# Patient Record
Sex: Female | Born: 1965 | Hispanic: No | Marital: Single | Smoking: Never smoker
Health system: Southern US, Community
[De-identification: ages and names within clinical notes are randomized; demographics above are authoritative.]

## PROBLEM LIST (undated history)

## (undated) DIAGNOSIS — E119 Type 2 diabetes mellitus without complications: Secondary | ICD-10-CM

## (undated) DIAGNOSIS — K469 Unspecified abdominal hernia without obstruction or gangrene: Secondary | ICD-10-CM

## (undated) HISTORY — PX: CATARACT EXTRACTION W/ INTRAOCULAR LENS  IMPLANT, BILATERAL: SHX1307

---

## 2007-02-20 ENCOUNTER — Ambulatory Visit: Payer: Self-pay | Admitting: Internal Medicine

## 2008-10-07 ENCOUNTER — Emergency Department (HOSPITAL_COMMUNITY): Admission: EM | Admit: 2008-10-07 | Discharge: 2008-10-07 | Payer: Self-pay | Admitting: Emergency Medicine

## 2013-07-09 DIAGNOSIS — H2510 Age-related nuclear cataract, unspecified eye: Secondary | ICD-10-CM | POA: Insufficient documentation

## 2013-07-09 DIAGNOSIS — H401190 Primary open-angle glaucoma, unspecified eye, stage unspecified: Secondary | ICD-10-CM | POA: Insufficient documentation

## 2014-01-14 ENCOUNTER — Encounter (HOSPITAL_COMMUNITY): Payer: Self-pay | Admitting: Emergency Medicine

## 2014-01-14 ENCOUNTER — Observation Stay (HOSPITAL_COMMUNITY)
Admission: EM | Admit: 2014-01-14 | Discharge: 2014-01-15 | Disposition: A | Discharge status: Home / Self Care | Payer: Medicaid Other | Attending: Internal Medicine | Admitting: Internal Medicine

## 2014-01-14 DIAGNOSIS — R5383 Other fatigue: Secondary | ICD-10-CM

## 2014-01-14 DIAGNOSIS — E119 Type 2 diabetes mellitus without complications: Secondary | ICD-10-CM | POA: Diagnosis not present

## 2014-01-14 DIAGNOSIS — R739 Hyperglycemia, unspecified: Secondary | ICD-10-CM | POA: Diagnosis present

## 2014-01-14 DIAGNOSIS — B3731 Acute candidiasis of vulva and vagina: Secondary | ICD-10-CM | POA: Diagnosis not present

## 2014-01-14 DIAGNOSIS — B373 Candidiasis of vulva and vagina: Secondary | ICD-10-CM | POA: Insufficient documentation

## 2014-01-14 DIAGNOSIS — R5381 Other malaise: Secondary | ICD-10-CM | POA: Diagnosis present

## 2014-01-14 HISTORY — DX: Type 2 diabetes mellitus without complications: E11.9

## 2014-01-14 HISTORY — DX: Unspecified abdominal hernia without obstruction or gangrene: K46.9

## 2014-01-14 LAB — CBC WITH DIFFERENTIAL/PLATELET
BASOS ABS: 0 10*3/uL (ref 0.0–0.1)
Basophils Relative: 0 % (ref 0–1)
EOS PCT: 1 % (ref 0–5)
Eosinophils Absolute: 0.1 10*3/uL (ref 0.0–0.7)
HEMATOCRIT: 42.9 % (ref 36.0–46.0)
Hemoglobin: 14.7 g/dL (ref 12.0–15.0)
LYMPHS ABS: 2.9 10*3/uL (ref 0.7–4.0)
LYMPHS PCT: 37 % (ref 12–46)
MCH: 31.1 pg (ref 26.0–34.0)
MCHC: 34.3 g/dL (ref 30.0–36.0)
MCV: 90.9 fL (ref 78.0–100.0)
MONO ABS: 0.4 10*3/uL (ref 0.1–1.0)
Monocytes Relative: 5 % (ref 3–12)
Neutro Abs: 4.5 10*3/uL (ref 1.7–7.7)
Neutrophils Relative %: 57 % (ref 43–77)
Platelets: 204 10*3/uL (ref 150–400)
RBC: 4.72 MIL/uL (ref 3.87–5.11)
RDW: 13.5 % (ref 11.5–15.5)
WBC: 7.8 10*3/uL (ref 4.0–10.5)

## 2014-01-14 LAB — URINALYSIS W MICROSCOPIC (NOT AT ARMC)
BILIRUBIN URINE: NEGATIVE
Hgb urine dipstick: NEGATIVE
KETONES UR: 15 mg/dL — AB
Leukocytes, UA: NEGATIVE
Nitrite: NEGATIVE
PH: 5 (ref 5.0–8.0)
Protein, ur: NEGATIVE mg/dL
Specific Gravity, Urine: 1.01 (ref 1.005–1.030)
Urobilinogen, UA: 0.2 mg/dL (ref 0.0–1.0)

## 2014-01-14 LAB — COMPREHENSIVE METABOLIC PANEL
ALBUMIN: 3.9 g/dL (ref 3.5–5.2)
ALK PHOS: 121 U/L — AB (ref 39–117)
ALT: 33 U/L (ref 0–35)
AST: 23 U/L (ref 0–37)
BUN: 13 mg/dL (ref 6–23)
CO2: 22 mEq/L (ref 19–32)
Calcium: 9.8 mg/dL (ref 8.4–10.5)
Chloride: 93 mEq/L — ABNORMAL LOW (ref 96–112)
Creatinine, Ser: 0.6 mg/dL (ref 0.50–1.10)
GFR calc Af Amer: >90 mL/min (ref >90)
GFR calc non Af Amer: >90 mL/min (ref >90)
Glucose, Bld: 691 mg/dL (ref 70–99)
POTASSIUM: 4.2 meq/L (ref 3.7–5.3)
SODIUM: 131 meq/L — AB (ref 137–147)
TOTAL PROTEIN: 7.3 g/dL (ref 6.0–8.3)
Total Bilirubin: 0.6 mg/dL (ref 0.3–1.2)

## 2014-01-14 LAB — I-STAT VENOUS BLOOD GAS, ED
ACID-BASE DEFICIT: 1 mmol/L (ref 0.0–2.0)
Bicarbonate: 25.8 mEq/L — ABNORMAL HIGH (ref 20.0–24.0)
O2 SAT: 40 %
TCO2: 27 mmol/L (ref 0–100)
pCO2, Ven: 48.5 mmHg (ref 45.0–50.0)
pH, Ven: 7.334 — ABNORMAL HIGH (ref 7.250–7.300)
pO2, Ven: 25 mmHg — CL (ref 30.0–45.0)

## 2014-01-14 LAB — CBG MONITORING, ED
GLUCOSE-CAPILLARY: 281 mg/dL — AB (ref 70–99)
Glucose-Capillary: 359 mg/dL — ABNORMAL HIGH (ref 70–99)

## 2014-01-14 LAB — GLUCOSE, CAPILLARY: Glucose-Capillary: 355 mg/dL — ABNORMAL HIGH (ref 70–99)

## 2014-01-14 MED ORDER — SODIUM CHLORIDE 0.9 % IV SOLN
INTRAVENOUS | Status: DC
  Administered 2014-01-14: 15:00:00 via INTRAVENOUS

## 2014-01-14 MED ORDER — INSULIN ASPART 100 UNIT/ML ~~LOC~~ SOLN: 0.0000 [IU] | Freq: Three times a day (TID) | SUBCUTANEOUS | Status: DC

## 2014-01-14 MED ORDER — DEXTROSE-NACL 5-0.45 % IV SOLN: INTRAVENOUS | Status: DC

## 2014-01-14 MED ORDER — SODIUM CHLORIDE 0.9 % IV BOLUS (SEPSIS)
1000.0000 mL | Freq: Once | INTRAVENOUS | Status: AC
  Administered 2014-01-14: 1000 mL via INTRAVENOUS

## 2014-01-14 MED ORDER — ENOXAPARIN SODIUM 40 MG/0.4ML ~~LOC~~ SOLN
40.0000 mg | SUBCUTANEOUS | Status: DC
  Administered 2014-01-14: 40 mg via SUBCUTANEOUS
  Filled 2014-01-14 (×2): qty 0.4

## 2014-01-14 MED ORDER — SODIUM CHLORIDE 0.9 % IV SOLN
INTRAVENOUS | Status: DC
  Filled 2014-01-14: qty 1

## 2014-01-14 MED ORDER — SODIUM CHLORIDE 0.9 % IV SOLN
INTRAVENOUS | Status: DC
  Administered 2014-01-14 – 2014-01-15 (×2): via INTRAVENOUS

## 2014-01-14 MED ORDER — DEXTROSE 50 % IV SOLN: 25.0000 mL | INTRAVENOUS | Status: DC | PRN

## 2014-01-14 MED ORDER — INSULIN REGULAR BOLUS VIA INFUSION
0.0000 [IU] | Freq: Three times a day (TID) | INTRAVENOUS | Status: DC
  Filled 2014-01-14: qty 10

## 2014-01-14 NOTE — Progress Notes (Signed)
Internal Medicine Attending Admission Note Date: 01/14/2014  Patient name: Keymora Aubut Medical record number: [id number] Date of birth: 06/22/1966 Age: 48 y.o. Gender: female  I saw and evaluated the patient. I reviewed the resident's note and I agree with the resident's findings and plan as documented in the resident's note.  Chief Complaint(s): worsening fatigue  History - key components related to admission: 48 y/o female with no known PMH p/w worsening fatigue over the past 2 weeks. She also complains of polyuria, polydipsia and dry mouth. In ED found to have CBG of 691. No n/v, no abd pain, no diarrhea, no fevers, no lightheadness, no syncope. Remaining ROS negative   Physical Exam - key components related to admission:  Filed Vitals:   01/14/14 1730 01/14/14 1800 01/14/14 1849 01/14/14 2105  BP: 130/74 118/71 137/83 100/62  Pulse: 71 63 62 75  Temp:   98.1 F (36.7 C) 98.1 F (36.7 C)  TempSrc:   Oral Oral  Resp: 17 18 16 16   Height:   5' (1.524 m)   Weight:   154 lb 1.6 oz (69.9 kg)   SpO2: 100% 99% 98% 96%  Cardio- RRR, normal heart sounds Lungs- CTA b/l Abd- soft, non tender, non distended, BS+ Ext- no pedal edema Gen - AAO*3, NAD  Lab results:   Basic Metabolic Panel:  Recent Labs  41/66/06 1200  NA 131*  K 4.2  CL 93*  CO2 22  GLUCOSE 691*  BUN 13  CREATININE 0.60  CALCIUM 9.8   Liver Function Tests:  Recent Labs  01/14/14 1200  AST 23  ALT 33  ALKPHOS 121*  BILITOT 0.6  PROT 7.3  ALBUMIN 3.9   No results found for this basename: LIPASE, AMYLASE,  in the last 72 hours No results found for this basename: AMMONIA,  in the last 72 hours CBC:  Recent Labs  01/14/14 1200  WBC 7.8  NEUTROABS 4.5  HGB 14.7  HCT 42.9  MCV 90.9  PLT 204   Cardiac Enzymes: No results found for this basename: CKTOTAL, CKMB, CKMBINDEX, TROPONINI,  in the last 72 hours BNP: No components found with this basename: POCBNP,  D-Dimer: No results found for this  basename: DDIMER,  in the last 72 hours CBG:  Recent Labs  01/14/14 1453 01/14/14 1627 01/14/14 2108  GLUCAP 359* 281* 355*   Hemoglobin A1C: No results found for this basename: HGBA1C,  in the last 72 hours Fasting Lipid Panel: No results found for this basename: CHOL, HDL, LDLCALC, TRIG, CHOLHDL, LDLDIRECT,  in the last 72 hours Thyroid Function Tests: No results found for this basename: TSH, T4TOTAL, FREET4, T3FREE, THYROIDAB,  in the last 72 hours Anemia Panel: No results found for this basename: VITAMINB12, FOLATE, FERRITIN, TIBC, IRON, RETICCTPCT,  in the last 72 hours Coagulation: No results found for this basename: INR,  in the last 72 hours Alcohol Level: No results found for this basename: ETH,  in the last 72 hours   Imaging results:  No results found.   Assessment & Plan by Problem:  Active Problems:   Diabetes   Hyperglycemia  New onset DM likely type 2 - c/w IVF - NS @125cc /hr - c/w SSI, sensitive. - Monitor CBG - if elevated will start lantus - f/u A1C - consult diabetes coordinator - Will likely need oral hypoglycemics on discharge

## 2014-01-14 NOTE — ED Notes (Signed)
Dr Rhunette Croft aware of critical Glucose.

## 2014-01-14 NOTE — ED Notes (Signed)
Pt reports that for 2 weeks she is feeling tired; dry mouth. Drinks a lot water and urinating frequently.

## 2014-01-14 NOTE — H&P (Signed)
Date: 01/14/2014               Patient Name:  Maria Short MRN: [id number]  DOB: 06-Dec-1965 Age / Sex: 48 y.o., female   PCP: No primary provider on file.         Medical Service: Internal Medicine Teaching Service         Attending Physician: Dr. Earl Lagos, MD    First Contact: Dr. Darci Needle Pager: [phone]  Second Contact: Dr. Zada Girt Pager: [phone]       After Hours (After 5p/  First Contact Pager: [phone]  weekends / holidays): Second Contact Pager: [phone]   Chief Complaint: fatigue, polyuria   History of Present Illness:  This is a 48yo Somali speaking woman with no known PMH who presents with c/o generalized fatigue, dry mouth, polyuria and polydipsia x 1-2 weeks. Patient with no known prior hx of diabetes, found to have CBG 691 in the ED. Patient feeling better s/p 3L NS at the time of my interview. Also complaining of some vaginal itching, though no dysuria, vaginal odor or discharge. Denies abd pain, N/V/D, CP, SOB.  Patient does not follow with a PCP.  She lives at home with her husband and 7 children. Had cataract surgery recently.  Meds: Current Facility-Administered Medications  Medication Dose Route Frequency Provider Last Rate Last Dose  . 0.9 %  sodium chloride infusion   Intravenous Continuous Tatyana A Kirichenko, PA-C 125 mL/hr at 01/14/14 1452    . 0.9 %  sodium chloride infusion   Intravenous Continuous Dow Adolph, MD      . dextrose 5 %-0.45 % sodium chloride infusion   Intravenous Continuous Tatyana A Kirichenko, PA-C      . dextrose 50 % solution 25 mL  25 mL Intravenous PRN Tatyana A Kirichenko, PA-C      . insulin regular (NOVOLIN R,HUMULIN R) 1 Units/mL in sodium chloride 0.9 % 100 mL infusion   Intravenous Continuous Tatyana A Kirichenko, PA-C      . insulin regular bolus via infusion 0-10 Units  0-10 Units Intravenous TID WC Tatyana A Kirichenko, PA-C       No current outpatient prescriptions on file.    Allergies: Allergies as of  01/14/2014  . (No Known Allergies)   Past Medical History  Diagnosis Date  . Cataract     sugery performed for them   History reviewed. No pertinent past surgical history. History reviewed. No pertinent family history. Niece has DMT2.  History   Social History  . Marital Status: Single    Spouse Name: N/A    Number of Children: N/A  . Years of Education: N/A   Occupational History  . Not on file.   Social History Main Topics  . Smoking status: Never Smoker   . Smokeless tobacco: No  . Alcohol Use: No  . Drug Use: No  . Sexual Activity: Not on file   Other Topics Concern  . Not on file   Social History Narrative  . No narrative on file   Review of Systems: A comprehensive 10 point ROS was performed and was negative other that what is noted in the HPI.  Physical Exam: Blood pressure 105/61, pulse 63, temperature 97.7 F (36.5 C), temperature source Oral, resp. rate 14, height 5' (1.524 m), weight 152 lb (68.947 kg), SpO2 98.00%. General: alert, cooperative, and in no apparent distress HEENT: pupils equal round and reactive to light, vision grossly intact, oropharynx clear and non-erythematous  Neck: supple  Lungs: CTAB Heart: RRR Abdomen: soft, non-tender, non-distended, normal bowel sounds Extremities: warm, no pedal edema Neurologic: alert & oriented X3, cranial nerves II-XII grossly intact, strength grossly intact, sensation intact to light touch  Lab results: Basic Metabolic Panel:  Recent Labs  16/05/9605/08/15 1200  NA 131*  K 4.2  CL 93*  CO2 22  GLUCOSE 691*  BUN 13  CREATININE 0.60  CALCIUM 9.8   Liver Function Tests:  Recent Labs  01/14/14 1200  AST 23  ALT 33  ALKPHOS 121*  BILITOT 0.6  PROT 7.3  ALBUMIN 3.9   CBC:  Recent Labs  01/14/14 1200  WBC 7.8  NEUTROABS 4.5  HGB 14.7  HCT 42.9  MCV 90.9  PLT 204   CBG:  Recent Labs  01/14/14 1453  GLUCAP 359*   Urinalysis:  Recent Labs  01/14/14 1330  COLORURINE STRAW*    LABSPEC 1.010  PHURINE 5.0  GLUCOSEU >1000*  HGBUR NEGATIVE  BILIRUBINUR NEGATIVE  KETONESUR 15*  PROTEINUR NEGATIVE  UROBILINOGEN 0.2  NITRITE NEGATIVE  LEUKOCYTESUR NEGATIVE   Other results: EKG: NSR, no ischemic changes noted   Assessment & Plan by Problem:  # New onset type 2 DM: Patient with polyuria, polydipsia, dry mouth, generalized fatigue found to have a CBG in the ED of 691. Patient feeling much improved after receiving approx 3L NS in the ED. VBG shows pt with normal pH. No concern for DKA at this time, no AG. CMP, CBC unremarkable. UA not significant other than >1000 glucose. Pt with possible yeast infection given itching, which will likely resolve once blood glucose under better control.  -admit to med surg for obs -NS at 125cc/hr -SSI, sensitive -plan to transition to oral hypoglycemic at time of discharge, pt will need to establish with a PCP -A1c -BMp in AM -carb mod diet -consult to diabetes coordinator   # VTE: lovenox  # Diet: carb mod  Code status: full   Dispo: Disposition is deferred at this time, awaiting improvement of current medical problems. Anticipated discharge in approximately 1-2 day(s).   The patient does not have a current PCP (No primary provider on file.) and [facility] after discharge.  The patient does not have transportation limitations that hinder transportation to clinic appointments.  Signed: Windell Hummingbirdachel Chikowski, MD 01/14/2014, 5:43 PM   I have reviewed the residents note and agree with plan and findings as outlined.Please refer to my note for further details

## 2014-01-14 NOTE — ED Notes (Signed)
Pt undressed, In gown, on monitor, pulse oximetry and bp cuff.

## 2014-01-14 NOTE — ED Provider Notes (Signed)
CSN: [id number]     Arrival date & time 01/14/14  1121 History   First MD Initiated Contact with Patient 01/14/14 1309     Chief Complaint  Patient presents with  . Fatigue     (Consider location/radiation/quality/duration/timing/severity/associated sxs/prior Treatment) HPI Maria Short is a 47 y.o. female who presents to emergency department complaining of generalized malaise, fatigue, dry mouth, urinary frequency and polydipsia. Patient states her symptoms started approximately 2 weeks ago. Patient denies any pain. She denies any headache. No chest pain, no abdominal pain. Denies any medical problems or taking any medications daily. She does not have a primary care doctor. Denies any recent ill contacts. Denies any URI symptoms. No nausea, vomiting, diarrhea.  Past Medical History  Diagnosis Date  . Cataract     sugery performed for them   No past surgical history on file. No family history on file. History  Substance Use Topics  . Smoking status: Not on file  . Smokeless tobacco: Not on file  . Alcohol Use: Not on file   OB History   Grav Para Term Preterm Abortions TAB SAB Ect Mult Living                 Review of Systems  Constitutional: Negative for fever and chills.  Respiratory: Negative for cough, chest tightness and shortness of breath.   Cardiovascular: Negative for chest pain, palpitations and leg swelling.  Gastrointestinal: Negative for nausea, vomiting, abdominal pain and diarrhea.  Genitourinary: Negative for dysuria, flank pain, vaginal bleeding, vaginal discharge, vaginal pain and pelvic pain.  Musculoskeletal: Negative for arthralgias, myalgias, neck pain and neck stiffness.  Skin: Negative for rash.  Neurological: Negative for dizziness, weakness and headaches.  All other systems reviewed and are negative.     Allergies  Review of patient's allergies indicates no known allergies.  Home Medications   Prior to Admission medications   Not on File    BP 113/96  Pulse 63  Temp(Src) 97.7 F (36.5 C) (Oral)  Resp 17  Ht 5' (1.524 m)  Wt 152 lb (68.947 kg)  BMI 29.69 kg/m2  SpO2 99% Physical Exam  Nursing note and vitals reviewed. Constitutional: She is oriented to person, place, and time. She appears well-developed and well-nourished. No distress.  HENT:  Head: Normocephalic.  Eyes: Conjunctivae are normal.  Neck: Neck supple.  Cardiovascular: Normal rate, regular rhythm and normal heart sounds.   Pulmonary/Chest: Effort normal and breath sounds normal. No respiratory distress. She has no wheezes. She has no rales.  Abdominal: Soft. Bowel sounds are normal. She exhibits no distension. There is no tenderness. There is no rebound.  Musculoskeletal: She exhibits no edema.  Neurological: She is alert and oriented to person, place, and time.  Skin: Skin is warm and dry.  Psychiatric: She has a normal mood and affect. Her behavior is normal.    ED Course  Procedures (including critical care time) Labs Review Labs Reviewed  COMPREHENSIVE METABOLIC PANEL - Abnormal; Notable for the following:    Sodium 131 (*)    Chloride 93 (*)    Glucose, Bld 691 (*)    Alkaline Phosphatase 121 (*)    All other components within normal limits  URINALYSIS W MICROSCOPIC - Abnormal; Notable for the following:    Color, Urine STRAW (*)    Glucose, UA >1000 (*)    Ketones, ur 15 (*)    All other components within normal limits  I-STAT VENOUS BLOOD GAS, ED - Abnormal; Notable for  the following:    pH, Ven 7.334 (*)    pO2, Ven 25.0 (*)    Bicarbonate 25.8 (*)    All other components within normal limits  CBG MONITORING, ED - Abnormal; Notable for the following:    Glucose-Capillary 359 (*)    All other components within normal limits  URINE CULTURE  CBC WITH DIFFERENTIAL    Imaging Review No results found.   EKG Interpretation None      MDM   Final diagnoses:  Hyperglycemia  Fatigue    Show generalized fatigue, polyuria,  polydipsia. Blood sugar is 691. No history of diabetes. IV fluids started, and glucose stabilizes started. Will admit given no primary care Dr any onset of diabetes. Discussed results with patient she agrees to the plan.    Spoke with teaching service will admit patient  Filed Vitals:   01/14/14 1415 01/14/14 1500 01/14/14 1521 01/14/14 1522  BP: 113/96 138/73    Pulse: 63 63 60 61  Temp:      TempSrc:      Resp: 17 15 18 16   Height:      Weight:      SpO2: 99% 98% 100% 100%      Lottie Musselatyana A Adelayde Minney, PA-C 01/14/14 1608  Merina Behrendt A Daniyah Fohl, PA-C 01/15/14 1126

## 2014-01-15 DIAGNOSIS — B3731 Acute candidiasis of vulva and vagina: Secondary | ICD-10-CM | POA: Diagnosis present

## 2014-01-15 DIAGNOSIS — B373 Candidiasis of vulva and vagina: Secondary | ICD-10-CM

## 2014-01-15 LAB — GLUCOSE, CAPILLARY
GLUCOSE-CAPILLARY: 184 mg/dL — AB (ref 70–99)
Glucose-Capillary: 173 mg/dL — ABNORMAL HIGH (ref 70–99)
Glucose-Capillary: 370 mg/dL — ABNORMAL HIGH (ref 70–99)
Glucose-Capillary: 399 mg/dL — ABNORMAL HIGH (ref 70–99)

## 2014-01-15 LAB — BASIC METABOLIC PANEL
BUN: 8 mg/dL (ref 6–23)
CHLORIDE: 106 meq/L (ref 96–112)
CO2: 20 mEq/L (ref 19–32)
Calcium: 8.1 mg/dL — ABNORMAL LOW (ref 8.4–10.5)
Creatinine, Ser: 0.47 mg/dL — ABNORMAL LOW (ref 0.50–1.10)
Glucose, Bld: 195 mg/dL — ABNORMAL HIGH (ref 70–99)
POTASSIUM: 3.5 meq/L — AB (ref 3.7–5.3)
Sodium: 138 mEq/L (ref 137–147)

## 2014-01-15 LAB — HEMOGLOBIN A1C
HEMOGLOBIN A1C: 15.3 % — AB (ref <5.7)
Mean Plasma Glucose: 392 mg/dL — ABNORMAL HIGH (ref <117)

## 2014-01-15 MED ORDER — FLUCONAZOLE 150 MG PO TABS
150.0000 mg | ORAL_TABLET | Freq: Once | ORAL | Status: AC
  Administered 2014-01-15: 150 mg via ORAL
  Filled 2014-01-15: qty 1

## 2014-01-15 MED ORDER — GLIPIZIDE 5 MG PO TABS: 2.5000 mg | ORAL_TABLET | Freq: Every day | ORAL | Status: DC

## 2014-01-15 MED ORDER — LIVING WELL WITH DIABETES BOOK
Freq: Once | Status: AC
  Administered 2014-01-15: 09:00:00
  Filled 2014-01-15: qty 1

## 2014-01-15 MED ORDER — GLIPIZIDE 5 MG PO TABS: 2.5000 mg | ORAL_TABLET | Freq: Two times a day (BID) | ORAL | Status: DC

## 2014-01-15 MED ORDER — INSULIN ASPART 100 UNIT/ML ~~LOC~~ SOLN
0.0000 [IU] | SUBCUTANEOUS | Status: DC
  Administered 2014-01-15: 9 [IU] via SUBCUTANEOUS
  Administered 2014-01-15: 2 [IU] via SUBCUTANEOUS
  Administered 2014-01-15: 9 [IU] via SUBCUTANEOUS
  Administered 2014-01-15: 2 [IU] via SUBCUTANEOUS

## 2014-01-15 MED ORDER — METFORMIN HCL 500 MG PO TABS: 500.0000 mg | ORAL_TABLET | Freq: Two times a day (BID) | ORAL | Status: DC

## 2014-01-15 NOTE — Discharge Summary (Signed)
Name: Maria Short MRN: [id number] DOB: 01/24/1966 48 y.o. PCP: No primary provider on file.  Date of Admission: 01/14/2014  1:09 PM Date of Discharge: 01/15/2014 Attending Physician: Earl Lagos, MD  Discharge Diagnosis: Active Problems:   Diabetes- newly diagnosed 01/14/14   Hyperglycemia   Yeast infection   Discharge Medications:   Medication List         glipiZIDE 5 MG tablet  Commonly known as:  GLUCOTROL  Take 0.5 tablets (2.5 mg total) by mouth daily before breakfast.     metFORMIN 500 MG tablet  Commonly known as:  GLUCOPHAGE  Take 1 tablet (500 mg total) by mouth 2 (two) times daily with a meal.       Disposition and follow-up:   [name] was discharged from [facility] in Stable condition.  At the hospital follow up visit please address:  1.  Diabetes, type 2- please assess CBG control and if pt has gotten a glucometer and filled her rx for glipizide and metformin; how is she tolerating these medications? Episodes of hypoglycemia? Please f/u A1c which was pending at time of discharge; please schedule pt to see Lupita Leash Plyler 2. Vaginal candidiasis- pt with vaginal itching on admission, gave fluconazole 150mg  once prior to discharge; please assess for resolution of symptoms  2.  Labs / imaging needed at time of follow-up: CBG 3.  Pending labs/ test needing follow-up: A1c still pending  Follow-up Appointments: Follow-up Information   Follow up with Christen Bame, MD On 01/29/2014. (8:45am)    Specialty:  Internal Medicine   Contact information:   [address] [place] [address] [phone]       Discharge Instructions: Discharge Instructions   Call MD for:  extreme fatigue    Complete by:  As directed      Call MD for:  persistant dizziness or light-headedness    Complete by:  As directed      Call MD for:  persistant nausea and vomiting    Complete by:  As directed      Call MD for:  temperature >100.4    Complete by:  As  directed      Diet - low sodium heart healthy    Complete by:  As directed      Increase activity slowly    Complete by:  As directed            Consultations:  none  Procedures Performed:  No results found.   Admission HPI:  This is a 48yo Somali speaking woman with no known PMH who presents with c/o generalized fatigue, dry mouth, polyuria and polydipsia x 1-2 weeks. Patient with no known prior hx of diabetes, found to have CBG 691 in the ED. Patient feeling better s/p 3L NS at the time of my interview. Also complaining of some vaginal itching, though no dysuria, vaginal odor or discharge. Denies abd pain, N/V/D, CP, SOB. Patient does not follow with a PCP. She lives at home with her husband and 7 children. Had cataract surgery recently.  Hospital Course by problem list:  # New onset type 2 DM: Patient with polyuria, polydipsia, dry mouth, generalized fatigue found to have a CBG in the ED of 691. VBG showed pt with normal pH. No concern for DKA, no AG. CMP, CBC unremarkable. UA not significant other than >1000 glucose. Patient's symptoms essentially completely resolved after approx 4.5L NS during her admission. Her VS remained stable overnight. Pt managed on SSI and  CBGs ranged 173-399 overnight, received a total of 11U SSI. Diabetes educator and dietician saw patient prior to her discharge. Patient was educated using the translator phone on her new dx, how to obtain a glucometer and check her CBG at home. She was told about symptoms of hyper and hypoglycemia to be aware of, and taught how to treat hypoglycemia. Pt provided w/ education booklet and given education about carbohydrates/dietary changes. Patient was discharged home on glipizide 2.5mg  daily and metformin 500mg  BID, which may need to be uptitrated as outpatient depending on how her CBGs run at home. She was asked to bring her meter with her to her HFU visit. A1c still pending at time of discharge, please follow this up at her f/u  visit.   #Vaginal candidiasis: Vaginal itching with erythema on exam, no discharge or odor. In the setting of undiagnosed DM, I suspect yeast infection. Pt requested something for treatment, so a one time dose of fluconazole 150mg  was given prior to discharge. Please assess for symptom resolution at f/u appt.  Discharge Vitals:   BP 114/73  Pulse 62  Temp(Src) 97.9 F (36.6 C) (Oral)  Resp 18  Ht 5' (1.524 m)  Wt 154 lb 1.6 oz (69.9 kg)  BMI 30.10 kg/m2  SpO2 96%  Discharge Labs:  Results for orders placed during the hospital encounter of 01/14/14 (from the past 24 hour(s))  URINALYSIS W MICROSCOPIC     Status: Abnormal   Collection Time    01/14/14  1:30 PM      Result Value Ref Range   Color, Urine STRAW (*) YELLOW   APPearance CLEAR  CLEAR   Specific Gravity, Urine 1.010  1.005 - 1.030   pH 5.0  5.0 - 8.0   Glucose, UA >1000 (*) NEGATIVE mg/dL   Hgb urine dipstick NEGATIVE  NEGATIVE   Bilirubin Urine NEGATIVE  NEGATIVE   Ketones, ur 15 (*) NEGATIVE mg/dL   Protein, ur NEGATIVE  NEGATIVE mg/dL   Urobilinogen, UA 0.2  0.0 - 1.0 mg/dL   Nitrite NEGATIVE  NEGATIVE   Leukocytes, UA NEGATIVE  NEGATIVE   Bacteria, UA RARE  RARE   Squamous Epithelial / LPF RARE  RARE  I-STAT VENOUS BLOOD GAS, ED     Status: Abnormal   Collection Time    01/14/14  1:53 PM      Result Value Ref Range   pH, Ven 7.334 (*) 7.250 - 7.300   pCO2, Ven 48.5  45.0 - 50.0 mmHg   pO2, Ven 25.0 (*) 30.0 - 45.0 mmHg   Bicarbonate 25.8 (*) 20.0 - 24.0 mEq/L   TCO2 27  0 - 100 mmol/L   O2 Saturation 40.0     Acid-base deficit 1.0  0.0 - 2.0 mmol/L   Collection site BRACHIAL ARTERY     Sample type VENOUS     Comment NOTIFIED PHYSICIAN    CBG MONITORING, ED     Status: Abnormal   Collection Time    01/14/14  2:53 PM      Result Value Ref Range   Glucose-Capillary 359 (*) 70 - 99 mg/dL  CBG MONITORING, ED     Status: Abnormal   Collection Time    01/14/14  4:27 PM      Result Value Ref Range    Glucose-Capillary 281 (*) 70 - 99 mg/dL  GLUCOSE, CAPILLARY     Status: Abnormal   Collection Time    01/14/14  9:08 PM      Result  Value Ref Range   Glucose-Capillary 355 (*) 70 - 99 mg/dL  GLUCOSE, CAPILLARY     Status: Abnormal   Collection Time    01/15/14 12:35 AM      Result Value Ref Range   Glucose-Capillary 399 (*) 70 - 99 mg/dL  GLUCOSE, CAPILLARY     Status: Abnormal   Collection Time    01/15/14  4:33 AM      Result Value Ref Range   Glucose-Capillary 173 (*) 70 - 99 mg/dL   Comment 1 Notify RN     Comment 2 Documented in Chart    BASIC METABOLIC PANEL     Status: Abnormal   Collection Time    01/15/14  5:51 AM      Result Value Ref Range   Sodium 138  137 - 147 mEq/L   Potassium 3.5 (*) 3.7 - 5.3 mEq/L   Chloride 106  96 - 112 mEq/L   CO2 20  19 - 32 mEq/L   Glucose, Bld 195 (*) 70 - 99 mg/dL   BUN 8  6 - 23 mg/dL   Creatinine, Ser 1.610.47 (*) 0.50 - 1.10 mg/dL   Calcium 8.1 (*) 8.4 - 10.5 mg/dL   GFR calc non Af Amer >90  >90 mL/min   GFR calc Af Amer >90  >90 mL/min  GLUCOSE, CAPILLARY     Status: Abnormal   Collection Time    01/15/14  7:58 AM      Result Value Ref Range   Glucose-Capillary 184 (*) 70 - 99 mg/dL  GLUCOSE, CAPILLARY     Status: Abnormal   Collection Time    01/15/14 11:49 AM      Result Value Ref Range   Glucose-Capillary 370 (*) 70 - 99 mg/dL    Signed: Windell Hummingbirdachel Juandaniel Manfredo, MD 01/15/2014, 1:08 PM   Time Spent on Discharge: 35 minutes Services Ordered on Discharge: none Equipment Ordered on Discharge: none

## 2014-01-15 NOTE — Progress Notes (Signed)
Pt being discharged home. Diabetes educator spoke with son in the room and I gave him the living with diabetes booklet. Went thru discharge instructions and prescriptions with son. Pt's son and patient verbalized understanding. 2 IV sites d/ced - site unremarkable. Skin intact, vitals stable. Pt and patient's son verbalized understanding of follow up appointments with primary care doctor on 6/23. No complaints at this time, Will continue to monitor until patient is off unit. Jaz Laningham D Rivka Safer

## 2014-01-15 NOTE — Progress Notes (Addendum)
Inpatient Diabetes Program Recommendations  AACE/ADA: New Consensus Statement on Inpatient Glycemic Control (2013)  Target Ranges:  Prepandial:   less than 140 mg/dL      Peak postprandial:   less than 180 mg/dL (1-2 hours)      Critically ill patients:  140 - 180 mg/dL     Results for Maria Short, Maria Short (MRN [id number]) as of 01/15/2014 08:44  Ref. Range 01/14/2014 12:00  Glucose Latest Range: 70-99 mg/dL 696 (HH)     Admitted with New diagnosis of DM.  Glucose 691 mg/dl on admission.  Was given 3L NS in ED and started on Novolog Sensitive SSI at midnight.  CBGs have trended down this morning.  RD consult placed for diet education.  We will need interpreter to assist with translation today.  Per RN, patient has brother who speaks Somali.  Have called SW office to try to arrange for [place] interpreter, however, they have advised me to use the telephonic interpreter line in case they cannot find a Somali interpreter.   1253 Addendum:  Spoke with patient and her son (son used as Nurse, learning disability).  Spoke with pt about new diagnosis.  Discussed basic pathophysiology of DM Type 2, basic home care, importance of checking CBGs and maintaining good CBG control to prevent long-term and short-term complications.  Reviewed signs and symptoms of hyperglycemia and hypoglycemia.  Reviewed how to treat hypoglycemia.  RNs to provide ongoing basic DM education at bedside with this patient.  Have ordered educational booklet.  RD spoke with patient and her son today about appropriate DM diet for home.  Also reviewed new DM medications (Metformin and Glipizide).  Explained what they are, how to take, side effects, etc.  Also discussed how to check CBGs at home.  Encouraged patient to purchase CBG meter OTC at [facility] (gave patient written information on which meter to buy).  Reviewed CBG goals with patient and son and encouraged patient to call MD at the Internal Medicine clinic if her CBGs become extremely elevated or if she has  lots of episodes of hypoglycemia.   Will see patient today Ambrose Finland RN, MSN, CDE Diabetes Coordinator Inpatient Diabetes Program Team Pager: [phone] (8a-10p)

## 2014-01-15 NOTE — Plan of Care (Signed)
Problem: Food- and Nutrition-Related Knowledge Deficit (NB-1.1) Goal: Nutrition education Formal process to instruct or train a patient/client in a skill or to impart knowledge to help patients/clients voluntarily manage or modify food choices and eating behavior to maintain or improve health. Outcome: Adequate for Discharge  RD consulted for nutrition education regarding diabetes.     No results found for this basename: HGBA1C   Pt does not speak [place]. Collaborated with pt's brother at bedside, who speaks [place] well. Homw diet is high in refined carbohydrates and sugar. Pt reveals that she adds sugar to milk and drink 1-2 bottles of soda daily.   RD provided "Carbohydrate Counting for People with Diabetes" handout from the Academy of Nutrition and Dietetics. Discussed different food groups and their effects on blood sugar, emphasizing carbohydrate-containing foods. Provided list of carbohydrates and recommended serving sizes of common foods.  Discussed importance of controlled and consistent carbohydrate intake throughout the day. Provided examples of ways to balance meals/snacks and encouraged intake of high-fiber, whole grain complex carbohydrates. Teach back method used.  Expect fair to good compliance.  Body mass index is 30.1 kg/(m^2). Pt meets criteria for obesity, class Ibased on current BMI.  Current diet order is carb modified, patient is consuming approximately 100% of meals at this time. Labs and medications reviewed. No further nutrition interventions warranted at this time. RD contact information provided. If additional nutrition issues arise, please re-consult RD.  Renn Stille A. Mayford Knife, RD, LDN Pager: [phone] After hours Pager: [phone]

## 2014-01-15 NOTE — Progress Notes (Addendum)
Subjective: Patient feeling much improved compared to yesterday. No more polyuria, polydipsia. Has more energy today. Still complaining of vaginal itching. Translator phone used for interview/exam.  Objective: Vital signs in last 24 hours: Filed Vitals:   01/14/14 1800 01/14/14 1849 01/14/14 2105 01/15/14 0437  BP: 118/71 137/83 100/62 114/73  Pulse: 63 62 75 62  Temp:  98.1 F (36.7 C) 98.1 F (36.7 C) 97.9 F (36.6 C)  TempSrc:  Oral Oral Oral  Resp: 18 16 16 18   Height:  5' (1.524 m)    Weight:  154 lb 1.6 oz (69.9 kg)    SpO2: 99% 98% 96% 96%   Weight change:   Intake/Output Summary (Last 24 hours) at 01/15/14 0743 Last data filed at 01/14/14 1453  Gross per 24 hour  Intake   1000 ml  Output      0 ml  Net   1000 ml   Physical Exam General: alert, cooperative, and in no apparent distress HEENT: pupils equal round and reactive to light, vision grossly intact, oropharynx clear and non-erythematous, MMM  Neck: supple Lungs: CTAB Heart: RRR Abdomen: soft, non-tender, non-distended, normal bowel sounds  GU: patient with some erythema to labia minora and majora, no vaginal discharge noted, no foul odor  Extremities: warm, no pedal edema Neurologic: alert & oriented X3, nonfocal   Lab Results: Basic Metabolic Panel:  Recent Labs Lab 01/14/14 1200 01/15/14 0551  NA 131* 138  K 4.2 3.5*  CL 93* 106  CO2 22 20  GLUCOSE 691* 195*  BUN 13 8  CREATININE 0.60 0.47*  CALCIUM 9.8 8.1*   Liver Function Tests:  Recent Labs Lab 01/14/14 1200  AST 23  ALT 33  ALKPHOS 121*  BILITOT 0.6  PROT 7.3  ALBUMIN 3.9   CBC:  Recent Labs Lab 01/14/14 1200  WBC 7.8  NEUTROABS 4.5  HGB 14.7  HCT 42.9  MCV 90.9  PLT 204   CBG:  Recent Labs Lab 01/14/14 1453 01/14/14 1627 01/14/14 2108 01/15/14 0035 01/15/14 0433  GLUCAP 359* 281* 355* 399* 173*   Hemoglobin A1C: No results found for this basename: HGBA1C,  in the last 168  hours  Urinalysis:  Recent Labs Lab 01/14/14 1330  COLORURINE STRAW*  LABSPEC 1.010  PHURINE 5.0  GLUCOSEU >1000*  HGBUR NEGATIVE  BILIRUBINUR NEGATIVE  KETONESUR 15*  PROTEINUR NEGATIVE  UROBILINOGEN 0.2  NITRITE NEGATIVE  LEUKOCYTESUR NEGATIVE   Medications: I have reviewed the patient's current medications. Scheduled Meds: . enoxaparin (LOVENOX) injection  40 mg Subcutaneous Q24H  . insulin aspart  0-9 Units Subcutaneous 6 times per day   Continuous Infusions: . sodium chloride 125 mL/hr at 01/15/14 0238   PRN Meds:. Assessment/Plan:  # New diagnosis of type 2 DM: Improvement of symptoms. VSS. CBGs ranged 173-399 overnight, received a total of 11U SSI. S/p approx 4.5L NS since her admission. BMP still shows no AG, no concern for DKA. Diabetes coordinator will come to see patient today. I will arrange PCP f/u in our clinic for patient and pt told that it is important to follow w/ a PCP regularly to monitor her blood sugar. A1c still pending.  -plan to discharge today with metformin 500mg  BID and glipizide 2.5mg  daily -[facility] w/ Norm Parcelonna Plyler  -DM coordinator to see -f/u A1c -SSI, sensitive -carb mod diet  #Vaginal candidiasis: Vaginal itching with erythema on exam, no discharge or odor. In the setting of undiagnosed DM, I suspect yeast infection. Pt requesting  something for treatment.  -one time dose of fluconazole 150mg  today prior to discharge  # VTE: lovenox  # Diet: carb mod  Code status: full   Dispo: Discharge today.  The patient does not have a current PCP (No primary provider on file.) and does need an [facility] hospital follow-up appointment after discharge.  The patient does not have transportation limitations that hinder transportation to clinic appointments.  .Services Needed at time of discharge: Y = Yes, Blank = No PT:   OT:   RN:   Equipment:   Other:     LOS: 1 day   Windell Hummingbird, MD 01/15/2014, 7:43 AM

## 2014-01-15 NOTE — Discharge Summary (Signed)
INTERNAL MEDICINE ATTENDING DISCHARGE COSIGN   I evaluated the patient on the day of discharge and discussed the discharge plan with my resident team. I agree with the discharge documentation and disposition.   Zayed Griffie 01/15/2014, 1:16 PM

## 2014-01-15 NOTE — Progress Notes (Signed)
Patient seen and examined. Feels well. No new complaints  Physical exam; Cardio- RRR, normal heart sounds  Lungs- CTA b/l  Abd- soft, non tender, non distended, BS+  Ext- no pedal edema  Gen - AAO*3, NAD   Assessment and Plan: I have reviewed Dr. Hollace Kinnier note and agree with findings and plan as documented  New diagnosis of DM likely Type 2: - f/u HbA1C - Will likely d/c home on metformin and glipizide - c/w SSI, sensitive - c/w modified carb diet - outpatient f/u at [facility] clinic and with Lupita Leash Pyler - Diabetes coordinator to follow up - Dietician follow up appreciated  Vaginal candidiasis - fluconazole * 1 dose today  Pt stable for d/c home today. Case d/w pt, residents in detail

## 2014-01-15 NOTE — Discharge Instructions (Signed)
We started you on two new medications to better control your blood sugar: metformin 500mg  twice per day and glipizide 2.5mg  (1/2 pill) daily. Please take these medications and do not miss doses.   Please also follow up in our clinic to establish with a primary care doctor.  Diabetes and Exercise Exercising regularly is important. It is not just about losing weight. It has many health benefits, such as:  Improving your overall fitness, flexibility, and endurance.  Increasing your bone density.  Helping with weight control.  Decreasing your body fat.  Increasing your muscle strength.  Reducing stress and tension.  Improving your overall health. People with diabetes who exercise gain additional benefits because exercise:  Reduces appetite.  Improves the body's use of blood sugar (glucose).  Helps lower or control blood glucose.  Decreases blood pressure.  Helps control blood lipids (such as cholesterol and triglycerides).  Improves the body's use of the hormone insulin by:  Increasing the body's insulin sensitivity.  Reducing the body's insulin needs.  Decreases the risk for heart disease because exercising:  Lowers cholesterol and triglycerides levels.  Increases the levels of good cholesterol (such as high-density lipoproteins [HDL]) in the body.  Lowers blood glucose levels. YOUR ACTIVITY PLAN  Choose an activity that you enjoy and set realistic goals. Your health care provider or diabetes educator can help you make an activity plan that works for you. You can break activities into 2 or 3 sessions throughout the day. Doing so is as good as one long session. Exercise ideas include:  Taking the dog for a walk.  Taking the stairs instead of the elevator.  Dancing to your favorite song.  Doing your favorite exercise with a friend. RECOMMENDATIONS FOR EXERCISING WITH TYPE 1 OR TYPE 2 DIABETES   Check your blood glucose before exercising. If blood glucose levels are  greater than 240 mg/dL, check for urine ketones. Do not exercise if ketones are present.  Avoid injecting insulin into areas of the body that are going to be exercised. For example, avoid injecting insulin into:  The arms when playing tennis.  The legs when jogging.  Keep a record of:  Food intake before and after you exercise.  Expected peak times of insulin action.  Blood glucose levels before and after you exercise.  The type and amount of exercise you have done.  Review your records with your health care provider. Your health care provider will help you to develop guidelines for adjusting food intake and insulin amounts before and after exercising.  If you take insulin or oral hypoglycemic agents, watch for signs and symptoms of hypoglycemia. They include:  Dizziness.  Shaking.  Sweating.  Chills.  Confusion.  Drink plenty of water while you exercise to prevent dehydration or heat stroke. Body water is lost during exercise and must be replaced.  Talk to your health care provider before starting an exercise program to make sure it is safe for you. Remember, almost any type of activity is better than none. Document Released: 10/16/2003 Document Revised: 03/28/2013 Document Reviewed: 01/02/2013 [facility] Patient Information 2014 [place], [place].

## 2014-01-16 LAB — URINE CULTURE

## 2014-01-16 NOTE — Discharge Planning (Signed)
[facility] Community Liaison was not able to see patient, [facility] orange card information and resource guide will be mailed to the address listed. °

## 2014-01-17 NOTE — ED Provider Notes (Signed)
Medical screening examination/treatment/procedure(s) were performed by non-physician practitioner and as supervising physician I was immediately available for consultation/collaboration.   EKG Interpretation   Date/Time:  Monday January 14 2014 14:09:49 EDT Ventricular Rate:  62 PR Interval:  156 QRS Duration: 76 QT Interval:  469 QTC Calculation: 476 R Axis:   21 Text Interpretation:  Sinus rhythm Consider left atrial enlargement Low  voltage, precordial leads Confirmed by Rhunette Croft, MD, Ronnetta Currington ([id number]) on  01/14/2014 4:32:07 PM       Derwood Kaplan, MD 01/17/14 0126

## 2014-01-28 IMAGING — CR DG WRIST COMPLETE 3+V*L*
2 series · 2 of 2 positions shown · non-contrast
Comparison: None.

CLINICAL DATA: Left wrist injury, pain.

LEFT WRIST - COMPLETE 3+ VIEW

[PA]
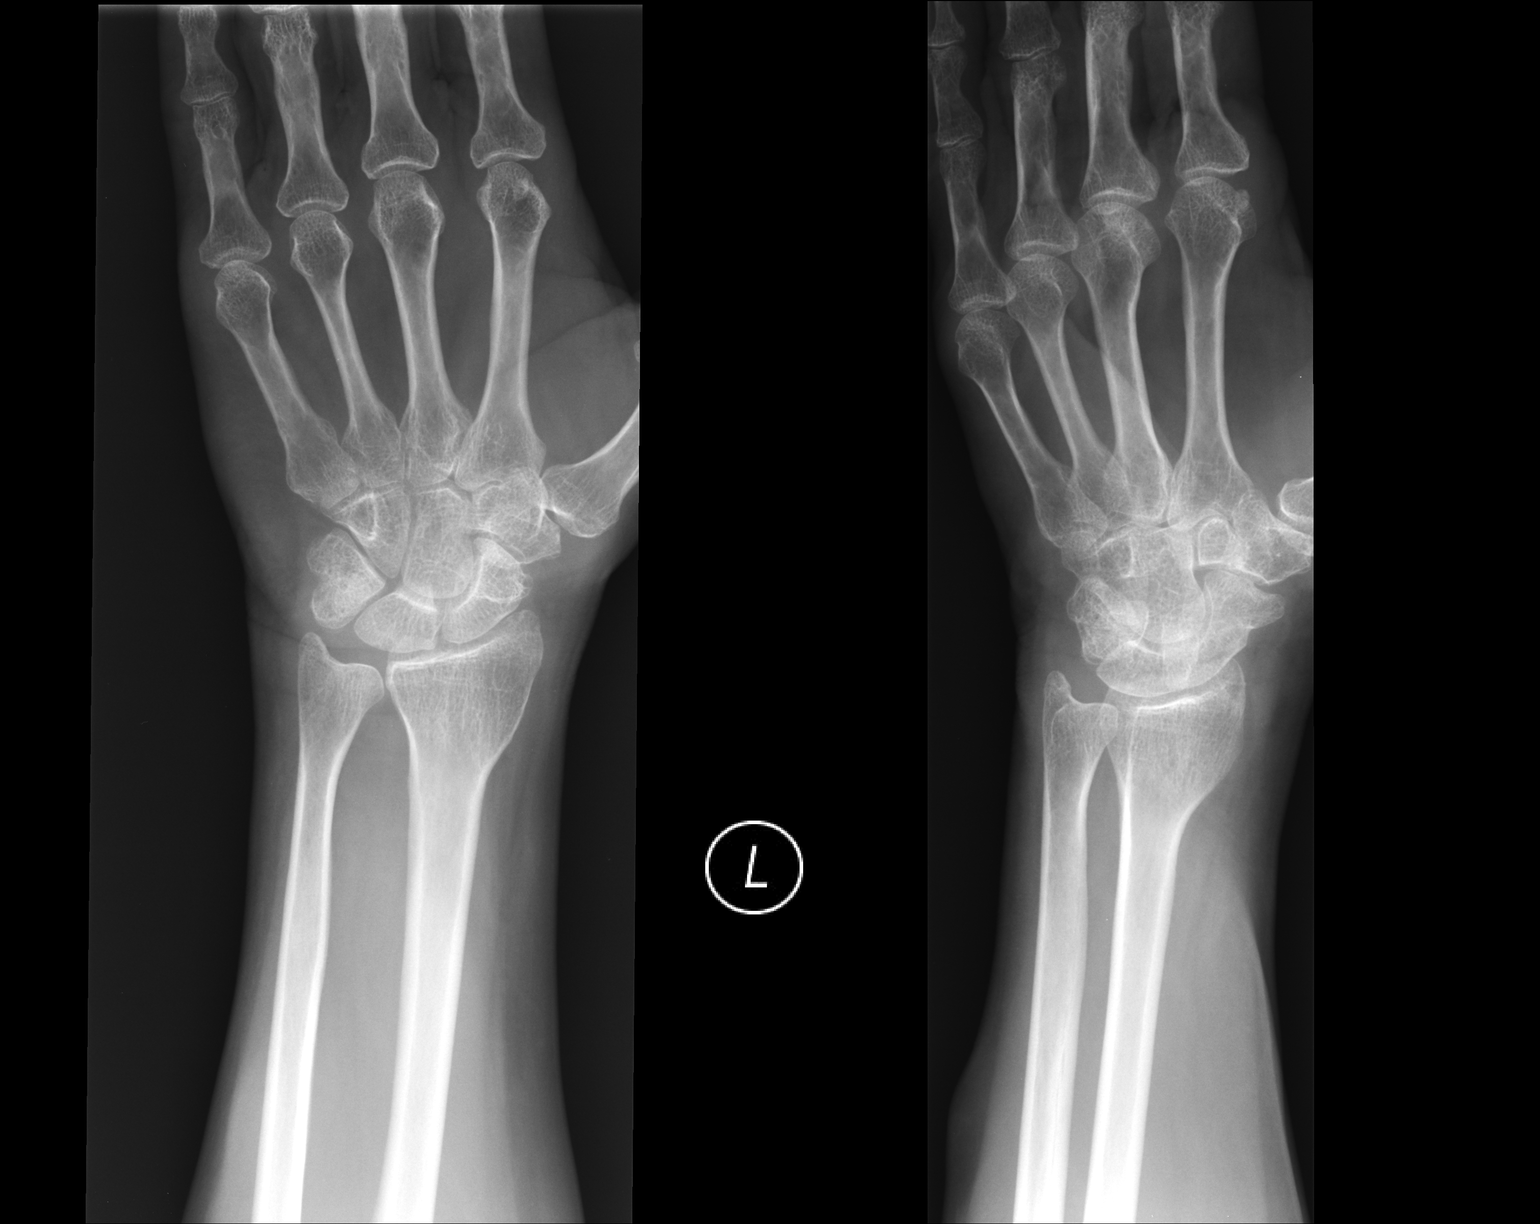

[lateral]
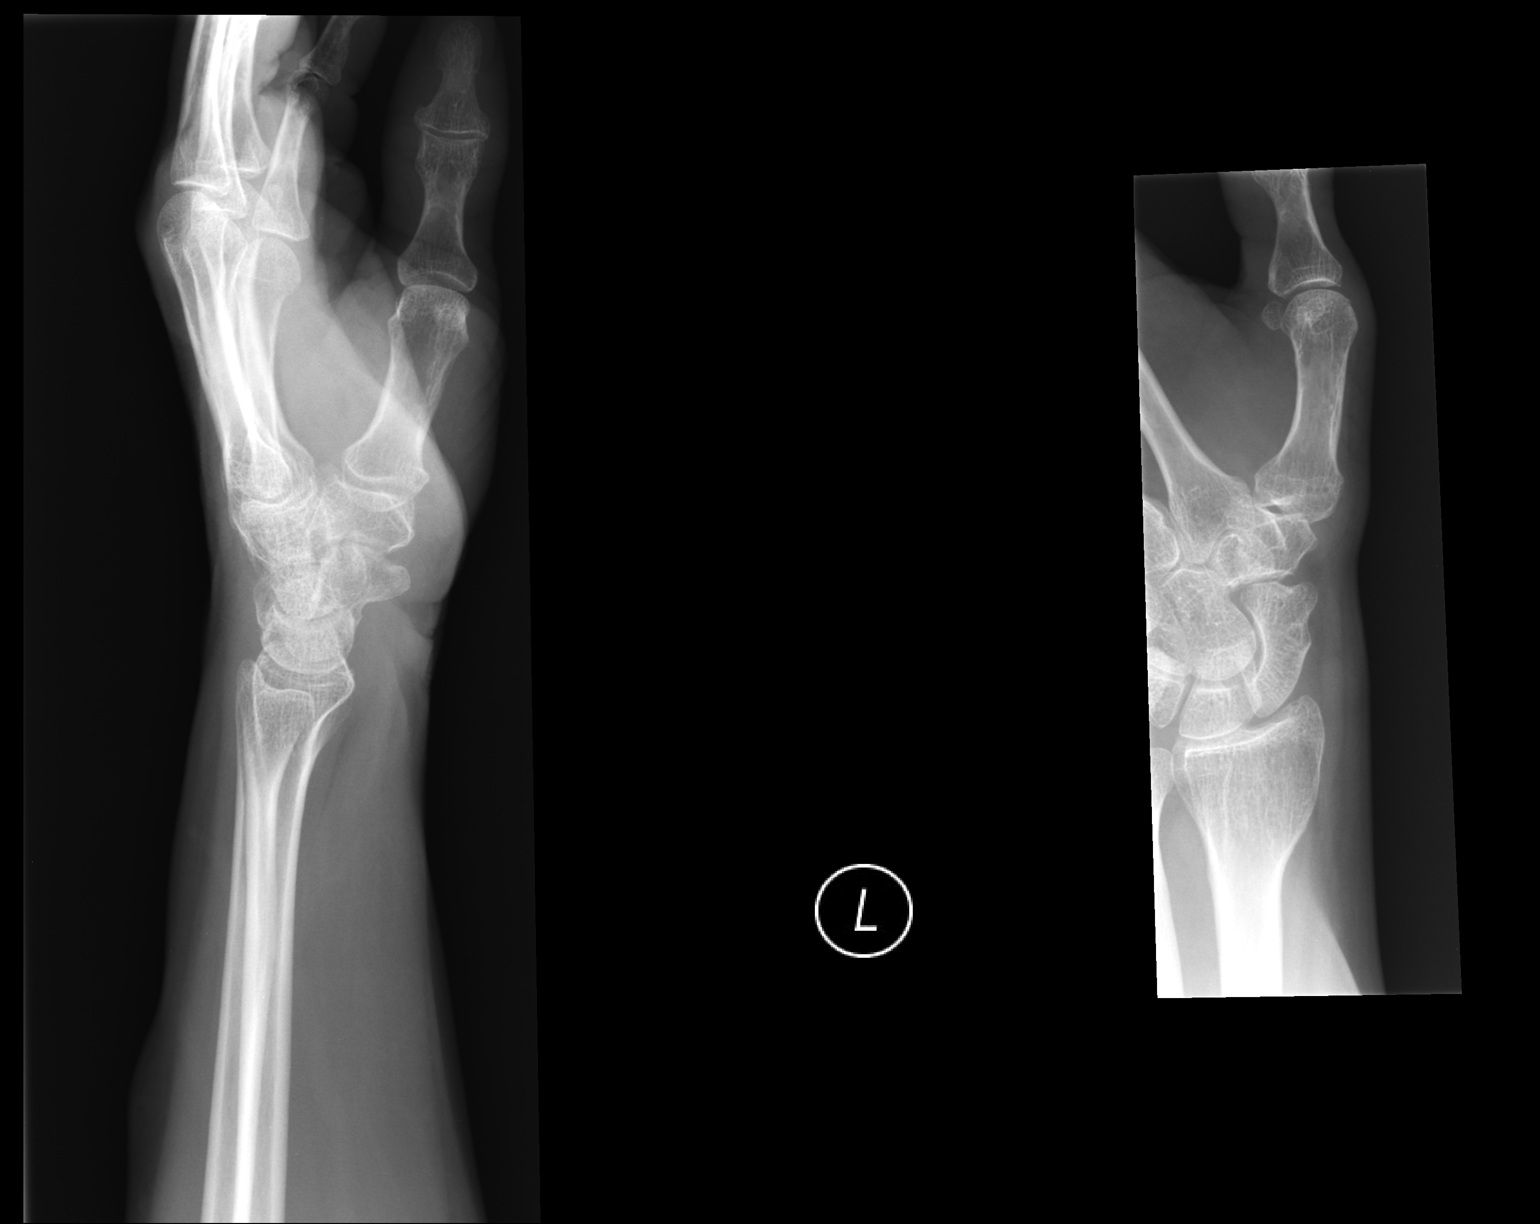

[2 of 2 positions shown; findings below may reference images not displayed]

FINDINGS: Imaged bones, joints and soft tissues appear normal.
IMPRESSION: Negative exam.

## 2014-01-29 ENCOUNTER — Ambulatory Visit (INDEPENDENT_AMBULATORY_CARE_PROVIDER_SITE_OTHER): Payer: Medicaid Other | Admitting: Internal Medicine

## 2014-01-29 VITALS — BP 110/71 | HR 74 | Temp 97.7°F | Ht 60.0 in | Wt 156.4 lb

## 2014-01-29 DIAGNOSIS — E119 Type 2 diabetes mellitus without complications: Secondary | ICD-10-CM

## 2014-01-29 LAB — GLUCOSE, CAPILLARY: Glucose-Capillary: 246 mg/dL — ABNORMAL HIGH (ref 70–99)

## 2014-01-29 MED ORDER — METFORMIN HCL 1000 MG PO TABS: 1000.0000 mg | ORAL_TABLET | Freq: Two times a day (BID) | ORAL | Status: DC

## 2014-01-29 MED ORDER — GLIPIZIDE 5 MG PO TABS: 5.0000 mg | ORAL_TABLET | Freq: Every day | ORAL | Status: DC

## 2014-01-29 NOTE — Progress Notes (Signed)
   Subjective:    Patient ID: Adolphus BirchwoodFaduma Avans, female    DOB: 11/15/1965, 48 y.o.   MRN: [id number]  HPI Ms. Rumler is a 48 yo som[place] woman pmh as listed below accompanied by her son (who is translating) for HFU.   Pt was admitted for new diagnosis of DM and candidal vaginitis. Since her discharge she has been taking her metformin at TID dosing and glypizide 1/2 pill BID (therefore metformin total of 1500 and glypizide 5mg ). She doesn't have her meter today for review. Patient checking blood sugars 1 time daily,after dinner.Per her report she has been having numbers in the 250-300 ranges but no hypoglycemic symptoms. denies polyuria, polydipsia, nausea, vomiting, diarrhea.  does not request refills today. One caveat is that her children all of them (apparently many) have been using her meter to check their own sugars. The patient has not been fasting during month of Ramadan. The patient feels that the "stress of her kids that causes her to yell" raises her blood sugar.   In terms of her vaginal itchiness it has resolved and pt is no longer having pain or discharge. Pt has no other complaints today.   The patient doesn't have insurance and her son said they are filling out pprwork for possibly obtaining the orange card.   Past Medical History  Diagnosis Date  . Abdominal hernia     "doesn't hurt; doesn't bother me; right in the middle; above belly button" (01/14/2014)  . Type II diabetes mellitus dx'd 01/14/2014   Current Outpatient Prescriptions on File Prior to Visit  Medication Sig Dispense Refill  . glipiZIDE (GLUCOTROL) 5 MG tablet Take 0.5 tablets (2.5 mg total) by mouth daily before breakfast.  30 tablet  0  . metFORMIN (GLUCOPHAGE) 500 MG tablet Take 1 tablet (500 mg total) by mouth 2 (two) times daily with a meal.  60 tablet  1   No current facility-administered medications on file prior to visit.   Social, surgical, family history reviewed with patient and updated in appropriate chart  locations.   Review of Systems  Negative except what listed in hpi     Objective:   Physical Exam Filed Vitals:   01/29/14 0904  BP: 110/71  Pulse: 74  Temp: 97.7 F (36.5 C)   General: sitting in chair, NAD  HEENT: PERRL, EOMI, no scleral icterus Cardiac: RRR, no rubs, murmurs or gallops Pulm: clear to auscultation bilaterally, moving normal volumes of air Abd: soft, nontender, nondistended, BS present Ext: warm and well perfused, no pedal edema Neuro: alert and oriented X3, cranial nerves II-XII grossly intact    Assessment & Plan:  Please see problem oriented charting  Pt discussed with Dr. Meredith PelJoines

## 2014-01-29 NOTE — Patient Instructions (Signed)
General Instructions: For your diabetes:  1. Check your blood sugars (only you and not any of your children!) 3 times a day for 1-2 wks until we can see you again  1. Before breakfast  2. 2 hr after lunch or before dinner  3. After dinner  2. We will change your medicine: Metformin 2 pills in the morning and at night, and 1 full pill of the glipizide in the morning   Please bring your medicines with you each time you come to clinic.  Medicines may include prescription medications, over-the-counter medications, herbal remedies, eye drops, vitamins, or other pills.   Progress Toward Treatment Goals:  No flowsheet data found.  Self Care Goals & Plans:  Self Care Goal 01/29/2014  Manage my medications take my medicines as prescribed; bring my medications to every visit; refill my medications on time  Eat healthy foods drink diet soda or water instead of juice or soda; eat more vegetables; eat foods that are low in salt; eat baked foods instead of fried foods; eat fruit for snacks and desserts    No flowsheet data found.   Care Management & Community Referrals:  No flowsheet data found.

## 2014-01-29 NOTE — Assessment & Plan Note (Addendum)
Lab Results  Component Value Date   HGBA1C 15.3* 01/15/2014     Assessment: Diabetes control:  very poor Progress toward A1C goal:   new diagnosis  Comments: Pt is a new diagnosis DM after presentation for hyperglycemia and vaginal candidiasis  Plan: Medications:  metformin 1000mg  BID and glypizide 5mg  Home glucose monitoring: Frequency:  asked pt to check TID for 1x2 wks and bring in results Timing:   Instruction/counseling given: reminded to get eye exam, reminded to bring blood glucose meter & log to each visit, reminded to bring medications to each visit, discussed foot care, discussed the need for weight loss, discussed diet and other instruction/counseling: was told not to fast during month of Ramadan until her sugars were better managed Educational resources provided: brochure (given to son) Self management tools provided: copy of home glucose meter download Other plans: will refer pt for orange card, extensive education into importance of the meter only being for the patient and that DM if not controlled given her severity can cause severe disability (kidney disease, blindness, neuropathy, death). DM educator referral as well. Foot exam performed today. At next visit (if pt agrees do retinal scan, microalbumin, lipid panel)

## 2014-01-31 NOTE — Progress Notes (Signed)
Case discussed with Dr. Sadek at the time of the visit.  We reviewed the resident's history and exam and pertinent patient test results.  I agree with the assessment, diagnosis, and plan of care documented in the resident's note. 

## 2014-02-05 ENCOUNTER — Ambulatory Visit: Payer: Self-pay

## 2014-02-05 ENCOUNTER — Inpatient Hospital Stay: Payer: Self-pay | Admitting: Internal Medicine

## 2014-02-07 IMAGING — CR DG WRIST COMPLETE 3+V*L*
2 series · 2 of 2 positions shown · non-contrast
Comparison: February 18, 2013

CLINICAL DATA: Pain

LEFT WRIST - COMPLETE 3+ VIEW

[PA]
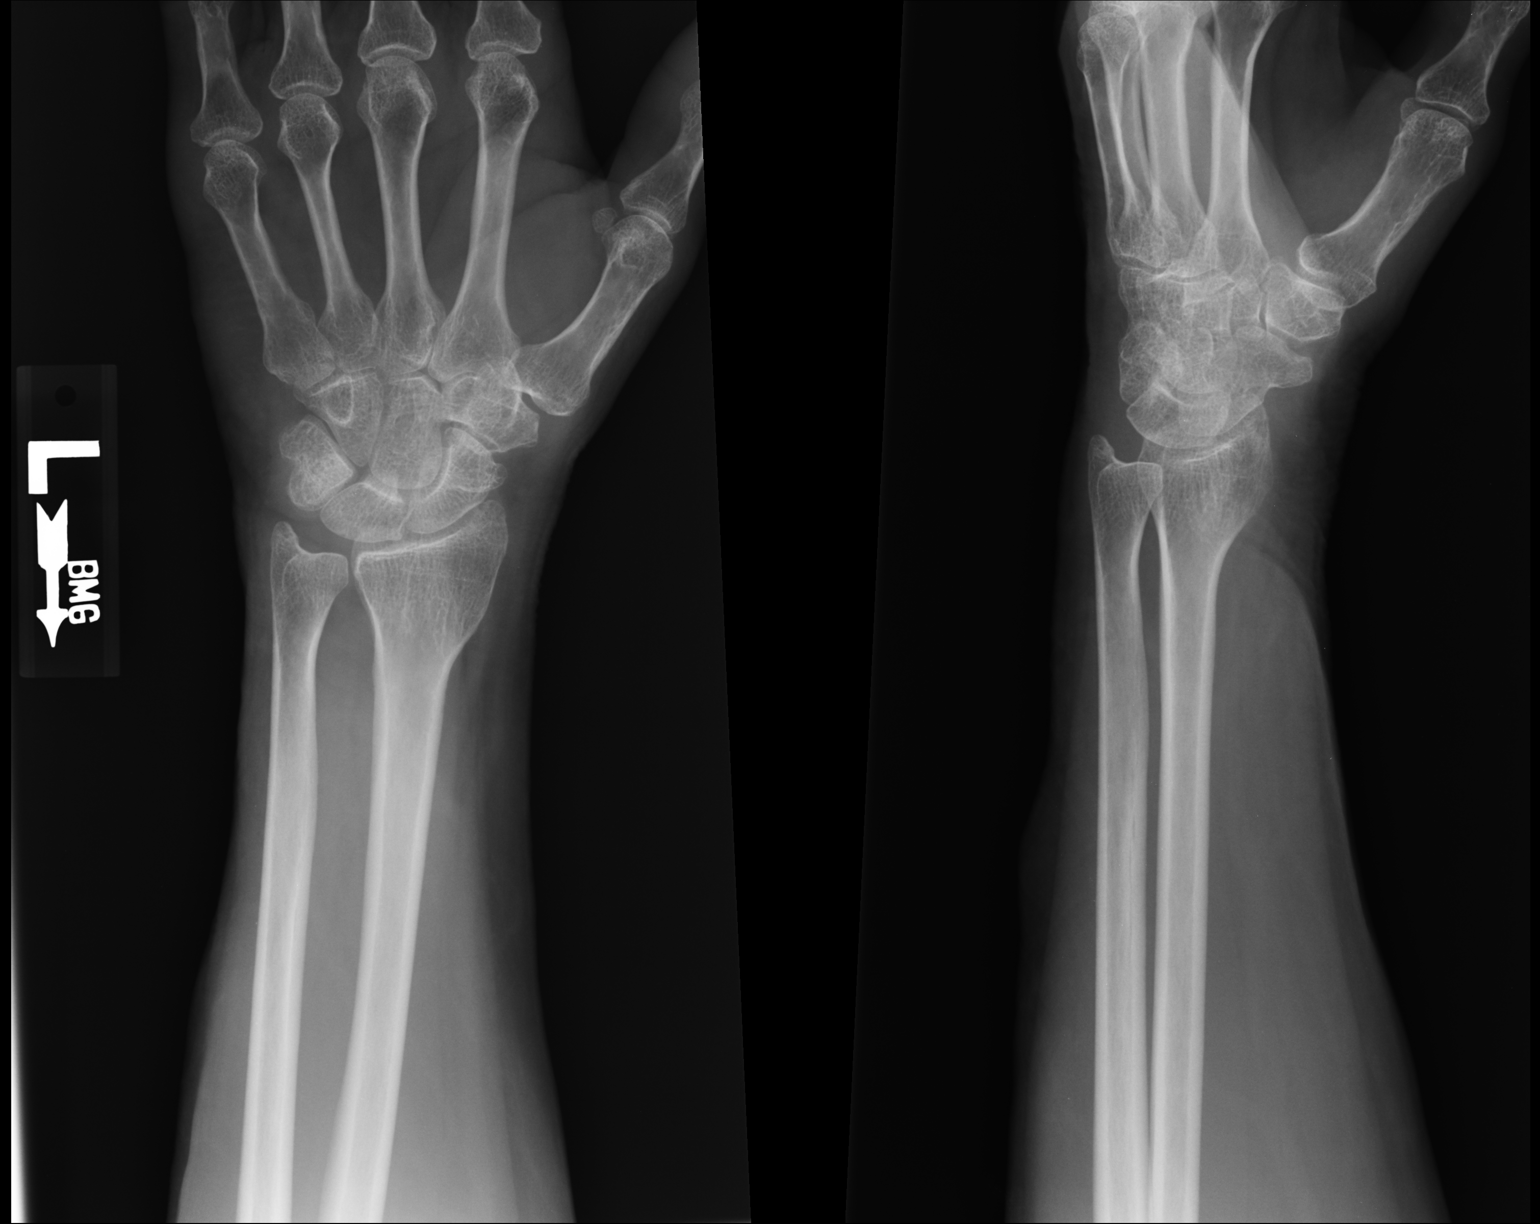

[lateral]
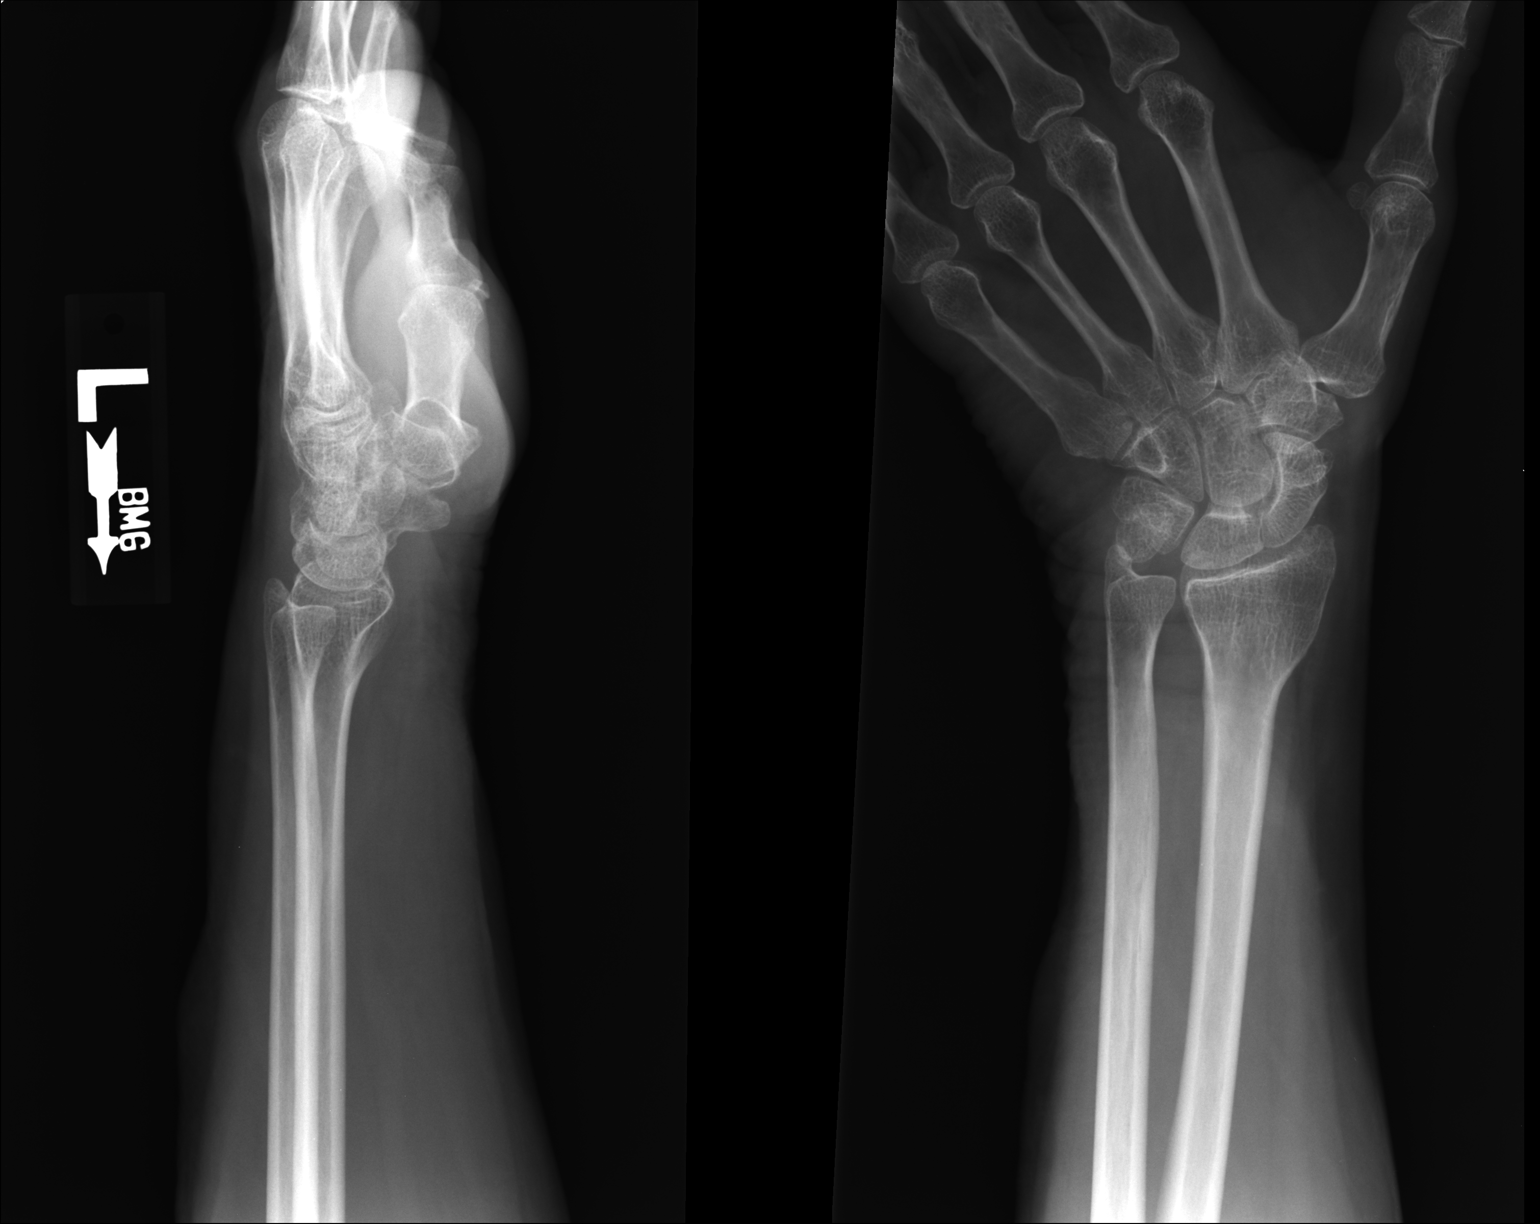

[2 of 2 positions shown; findings below may reference images not displayed]

FINDINGS: Frontal, oblique, lateral, and ulnar deviation scaphoid
images were obtained.  There is no fracture or dislocation.  There
is osteoarthritic change in the scaphotrapezial and scaphotrapezial
joints.  No erosive change.
IMPRESSION: Areas of osteoarthritic change.  No fracture or
dislocation.

## 2014-02-12 ENCOUNTER — Encounter: Payer: Self-pay | Admitting: Internal Medicine

## 2014-02-12 ENCOUNTER — Ambulatory Visit (INDEPENDENT_AMBULATORY_CARE_PROVIDER_SITE_OTHER): Payer: Medicaid Other | Admitting: Internal Medicine

## 2014-02-12 VITALS — BP 108/69 | HR 75 | Temp 97.8°F | Ht 60.0 in | Wt 158.2 lb

## 2014-02-12 DIAGNOSIS — IMO0001 Reserved for inherently not codable concepts without codable children: Secondary | ICD-10-CM

## 2014-02-12 DIAGNOSIS — E1165 Type 2 diabetes mellitus with hyperglycemia: Principal | ICD-10-CM

## 2014-02-12 DIAGNOSIS — E119 Type 2 diabetes mellitus without complications: Secondary | ICD-10-CM

## 2014-02-12 LAB — LIPID PANEL
CHOLESTEROL: 206 mg/dL — AB (ref 0–200)
HDL: 51 mg/dL (ref >39)
LDL CALC: 122 mg/dL — AB (ref 0–99)
Total CHOL/HDL Ratio: 4 Ratio
Triglycerides: 163 mg/dL — ABNORMAL HIGH (ref <150)
VLDL: 33 mg/dL (ref 0–40)

## 2014-02-12 LAB — GLUCOSE, CAPILLARY: GLUCOSE-CAPILLARY: 116 mg/dL — AB (ref 70–99)

## 2014-02-12 MED ORDER — GLIPIZIDE 5 MG PO TABS: 5.0000 mg | ORAL_TABLET | Freq: Every day | ORAL | Status: DC

## 2014-02-12 NOTE — Progress Notes (Signed)
   Subjective:    Patient ID: Maria BirchwoodFaduma Short, female    DOB: 06/25/1966, 48 y.o.   MRN: [id number]  HPI Comments: [name] is a [age] year old [place] woman who presents for DM follow-up.  She has no complaints.  Reports compliance with metformin and glipizide.  She denies abdominal pain, diarrhea, N/V, dizziness, lightheadedness or increased urinary frequency.  She has not had hypoglycemic events but knows to eat or drink something sweet if BG is low.  She is cooking meals at home and limiting carbs.  She is walking for exercise.       Review of Systems  Constitutional: Negative for fever, chills and appetite change.  HENT: Negative for rhinorrhea and sore throat.   Eyes: Negative for visual disturbance.  Respiratory: Negative for cough and shortness of breath.   Cardiovascular: Negative for chest pain.  Gastrointestinal: Negative for nausea, vomiting, abdominal pain and diarrhea.  Endocrine: Negative for polyuria.  Genitourinary: Negative for dysuria.  Neurological: Negative for dizziness, syncope and light-headedness.       Objective:   Physical Exam  Constitutional: She is oriented to person, place, and time. She appears well-developed. No distress.  HENT:  Mouth/Throat: Oropharynx is clear and moist. No oropharyngeal exudate.  Eyes: Pupils are equal, round, and reactive to light. Right eye exhibits no discharge. Left eye exhibits no discharge.  Cardiovascular: Normal rate and regular rhythm.  Exam reveals no gallop and no friction rub.   No murmur heard. Pulmonary/Chest: Effort normal. No respiratory distress. She has no wheezes. She has no rales.  Abdominal: Soft. Bowel sounds are normal. She exhibits no distension. There is no tenderness.  Musculoskeletal: Normal range of motion. She exhibits no edema.  Neurological: She is alert and oriented to person, place, and time.  Skin: Skin is warm. She is not diaphoretic.  Psychiatric: She has a normal mood and affect. Her behavior is  normal.          Assessment & Plan:  Please see problem based assessment and plan.

## 2014-02-12 NOTE — Patient Instructions (Signed)
1. Please return to clinic in 2 months for follow-up.   2. Please take all medications as prescribed.  Thank you for bringing your medicines today. This helps us keep you safe from mistakes.   3. If you have worsening of your symptoms or new symptoms arise, please call the clinic [phone] or go to the ER immediately if symptoms are severe.

## 2014-02-12 NOTE — Assessment & Plan Note (Addendum)
Lab Results  Component Value Date   HGBA1C 15.3* 01/15/2014     Assessment: Diabetes control:  uncontrolled Progress toward A1C goal:   unable to assess, but BG improving Comments: Patient is checking BG 1-2x per day.  No hypoglycemic events.  She is cooking her meals at home and is not fasting.  She is also walking for exercise.  Other family members are no longer using her BG meter and readings are 85-upper 200s.  BG 116 in clinic this morning.  Plan: Medications:  continue current medications:  Metformin 1000mg  BID and glipizide 5mg  daily Home glucose monitoring:  At least twice daily. Frequency:   Timing:   Instruction/counseling given: reminded to bring blood glucose meter & log to each visit Educational resources provided:   Self management tools provided:   Other plans: urine microalb/Cr and lipid panel today; will defer retinal scan as patient is uninsured and had cataract surgery in Dec 2014 in [place] (recent eye exam); have asked patient to call with Eye doctor's name so we can send for records; return to clinic in 2 months for follow-up/HgbA1c

## 2014-02-13 LAB — MICROALBUMIN / CREATININE URINE RATIO
Creatinine, Urine: 32.7 mg/dL
MICROALB UR: 0.5 mg/dL (ref 0.00–1.89)
Microalb Creat Ratio: 15.3 mg/g (ref 0.0–30.0)

## 2014-02-14 ENCOUNTER — Other Ambulatory Visit: Payer: Self-pay | Admitting: Internal Medicine

## 2014-02-14 ENCOUNTER — Telehealth: Payer: Self-pay | Admitting: Internal Medicine

## 2014-02-14 MED ORDER — LOVASTATIN 20 MG PO TABS: 40.0000 mg | ORAL_TABLET | Freq: Every day | ORAL | Status: DC

## 2014-02-14 NOTE — Progress Notes (Signed)
Case discussed with Dr. Wilson at the time of the visit.  We reviewed the resident's history and exam and pertinent patient test results.  I agree with the assessment, diagnosis, and plan of care documented in the resident's note. 

## 2014-02-14 NOTE — Telephone Encounter (Signed)
I called and spoke with [name]'s daughter (patient speaks limited [place]).  I informed her I am sending lovastatin 40mg  to her pharmacy [facility]).  I instructed her to have her mom take two 20mg  tablets qHS.  Lovastatin 10mg /20mg  is only statin on the four dollar list and patient is uninsured.

## 2014-02-18 ENCOUNTER — Ambulatory Visit: Payer: Self-pay

## 2014-02-19 ENCOUNTER — Ambulatory Visit: Payer: Self-pay

## 2014-03-07 ENCOUNTER — Encounter: Payer: Self-pay | Admitting: Internal Medicine

## 2014-03-07 ENCOUNTER — Ambulatory Visit (INDEPENDENT_AMBULATORY_CARE_PROVIDER_SITE_OTHER): Payer: No Typology Code available for payment source | Admitting: Internal Medicine

## 2014-03-07 VITALS — BP 106/72 | HR 68 | Temp 97.0°F | Ht 60.0 in | Wt 160.1 lb

## 2014-03-07 DIAGNOSIS — K089 Disorder of teeth and supporting structures, unspecified: Secondary | ICD-10-CM

## 2014-03-07 DIAGNOSIS — E785 Hyperlipidemia, unspecified: Secondary | ICD-10-CM

## 2014-03-07 MED ORDER — PRAVASTATIN SODIUM 40 MG PO TABS: 40.0000 mg | ORAL_TABLET | Freq: Every evening | ORAL | Status: DC

## 2014-03-07 NOTE — Patient Instructions (Signed)
1. We will call you with your dental appointment information.  [facility] free [facility] clinic there.    2. Please take all medications as prescribed.     3. If you have worsening of your symptoms or new symptoms arise, please call the clinic [phone] or go to the ER immediately if symptoms are severe.

## 2014-03-07 NOTE — Assessment & Plan Note (Signed)
Low 10 year ASCVD risk (1.7%), however LDL, TG and TC slightly elevated and she should be on moderate intensity statin given her DM.  Lovastatin 40mg  prescribed earlier this month (patient uninsured and lovastatin is only statin on [facility] $4 list) and the patient says she notices occasional headache after she takes it.   - stop lovastatin and start pravastatin 40mg  daily (patient now has [employer] and [facility].)

## 2014-03-07 NOTE — Assessment & Plan Note (Signed)
She has poor dentition.  Dental pain x years, no acute change.   No fevers, facial swelling or difficulty eating.  Patient was provided with referral to dentist.  She was also [facility] just in case the dental visit cannot be scheduled soon.

## 2014-03-07 NOTE — Progress Notes (Signed)
   Subjective:    Patient ID: Maria Short Short, female    DOB: 12/19/1965, 48 y.o.   MRN: [id number]  HPI Comments: Maria Short is a 48 year old woman with DM type 2 (recently dx, Hgb A1c 15.09 January 2014) who presents for referral to dentist.  She reports mild dental pain x 2 years.  No change in pain, no fevers, no swelling of gums or face, no problems eating.  She has had cavities in the past and has had teeth removed.  Otherwise doing well.  Compliant with medications.  Reports CBGs 78-low 200s.      Review of Systems  Constitutional: Negative for fever, chills and appetite change.  HENT: Positive for dental problem.   Respiratory: Negative for shortness of breath.   Cardiovascular: Negative for chest pain.  Gastrointestinal: Negative for nausea, vomiting, abdominal pain, diarrhea and constipation.  Genitourinary: Negative for dysuria.  Neurological: Negative for syncope, weakness and light-headedness.       Objective:   Physical Exam  Vitals reviewed. Constitutional: She is oriented to person, place, and time. She appears well-developed. No distress.  HENT:  Head: Normocephalic and atraumatic.  Mouth/Throat: Oropharynx is clear and moist. No oropharyngeal exudate.  Poor dentition/missing teeth; no gum swelling or redness.  Eyes: EOM are normal. Pupils are equal, round, and reactive to light.  Cardiovascular: Normal rate, regular rhythm and normal heart sounds.  Exam reveals no gallop and no friction rub.   No murmur heard. Pulmonary/Chest: Effort normal and breath sounds normal. No respiratory distress. She has no wheezes. She has no rales.  Abdominal: Soft. Bowel sounds are normal. She exhibits no distension. There is no tenderness.  Musculoskeletal: Normal range of motion. She exhibits no edema and no tenderness.  Neurological: She is alert and oriented to person, place, and time. No cranial nerve deficit.  Skin: Skin is warm. She is not diaphoretic.  Psychiatric: She has a normal  mood and affect. Her behavior is normal.          Assessment & Plan:  Please see problem based assessment and plan.

## 2014-03-08 NOTE — Progress Notes (Signed)
Case discussed with Dr. Wilson at the time of the visit.  We reviewed the resident's history and exam and pertinent patient test results.  I agree with the assessment, diagnosis, and plan of care documented in the resident's note. 

## 2014-04-17 ENCOUNTER — Encounter: Payer: Self-pay | Admitting: Internal Medicine

## 2014-04-18 ENCOUNTER — Other Ambulatory Visit: Payer: Self-pay | Admitting: *Deleted

## 2014-04-18 DIAGNOSIS — E119 Type 2 diabetes mellitus without complications: Secondary | ICD-10-CM

## 2014-04-18 DIAGNOSIS — IMO0001 Reserved for inherently not codable concepts without codable children: Secondary | ICD-10-CM

## 2014-04-18 DIAGNOSIS — E1165 Type 2 diabetes mellitus with hyperglycemia: Secondary | ICD-10-CM

## 2014-04-18 MED ORDER — GLIPIZIDE 5 MG PO TABS: 5.0000 mg | ORAL_TABLET | Freq: Every day | ORAL | Status: DC

## 2014-04-18 MED ORDER — METFORMIN HCL 1000 MG PO TABS
1000.0000 mg | ORAL_TABLET | Freq: Two times a day (BID) | ORAL | Status: DC
Start: 2014-04-18 — End: 2014-07-10

## 2014-05-15 ENCOUNTER — Encounter: Payer: No Typology Code available for payment source | Admitting: Internal Medicine

## 2014-05-15 ENCOUNTER — Encounter: Payer: Self-pay | Admitting: Internal Medicine

## 2014-05-21 NOTE — Addendum Note (Signed)
Addended by: Bufford SpikesFULCHER, Anniah Glick N on: 05/21/2014 12:30 PM   Modules accepted: Orders

## 2014-06-05 ENCOUNTER — Encounter: Payer: Self-pay | Admitting: *Deleted

## 2014-06-05 ENCOUNTER — Telehealth: Payer: Self-pay | Admitting: *Deleted

## 2014-06-05 NOTE — Telephone Encounter (Signed)
CALLED PATIENT AND LEFT VOICE MESSAGE FOR PATIENT TO CALL THE DENTAL CLINIC.  DENTAL CLINIC HAS BEEN TRYING TO GET IN CONTACT WITH HER FOR SOMETIME, OR SHE CAN CALL THE CLINIC HERE AT [phone]. SENDING LETTER TO PATIENT ALSO / DENTAL CLINIC # [phone].  Maria Short NTII   10-28.015  9:13AM

## 2014-07-10 ENCOUNTER — Ambulatory Visit (INDEPENDENT_AMBULATORY_CARE_PROVIDER_SITE_OTHER): Payer: Medicaid Other | Admitting: Internal Medicine

## 2014-07-10 ENCOUNTER — Encounter: Payer: Self-pay | Admitting: Internal Medicine

## 2014-07-10 VITALS — BP 122/69 | HR 64 | Temp 98.1°F | Ht 60.0 in | Wt 161.3 lb

## 2014-07-10 DIAGNOSIS — Z0000 Encounter for general adult medical examination without abnormal findings: Secondary | ICD-10-CM

## 2014-07-10 DIAGNOSIS — E785 Hyperlipidemia, unspecified: Secondary | ICD-10-CM

## 2014-07-10 DIAGNOSIS — E119 Type 2 diabetes mellitus without complications: Secondary | ICD-10-CM

## 2014-07-10 LAB — GLUCOSE, CAPILLARY: GLUCOSE-CAPILLARY: 147 mg/dL — AB (ref 70–99)

## 2014-07-10 LAB — POCT GLYCOSYLATED HEMOGLOBIN (HGB A1C): Hemoglobin A1C: 6.5

## 2014-07-10 MED ORDER — METFORMIN HCL 1000 MG PO TABS: 1000.0000 mg | ORAL_TABLET | Freq: Two times a day (BID) | ORAL | Status: DC

## 2014-07-10 MED ORDER — GLIPIZIDE 5 MG PO TABS: 5.0000 mg | ORAL_TABLET | Freq: Every day | ORAL | Status: DC

## 2014-07-10 MED ORDER — PRAVASTATIN SODIUM 40 MG PO TABS: 40.0000 mg | ORAL_TABLET | Freq: Every evening | ORAL | Status: DC

## 2014-07-10 NOTE — Assessment & Plan Note (Signed)
Her son says she did not like how she felt when she took lovastatin.  He is unable to explain specific symptom.  She mentioned headache at prior visit.  She has not had recent headaches and no longer takes the lovastatin.  I have asked her to start pravastatin 40mg  daily and to contact me if she has symptoms while on this medication.

## 2014-07-10 NOTE — Assessment & Plan Note (Addendum)
Lab Results  Component Value Date   HGBA1C 6.5 07/10/2014   HGBA1C 15.3* 01/15/2014     Assessment: Diabetes control:  well controlled Progress toward A1C goal:   at goal Comments:  She reports compliance with glipizide and metformin.  Tolerating metformin, no GI complaints.  No hypoglycemia on glipzide.  She did not bring her meter but reports values around 130s-150s.  No values above 200, no values below 100, no hypoglycemic symptoms.   Plan: Medications:  continue current medications:  Metformin 1000mg  BID and glipizide 5mg  daily. Home glucose monitoring:   Frequency:  3x Timing:   Instruction/counseling given: reminded to bring blood glucose meter & log to each visit; reminded to go to eye exam Educational resources provided: brochure (has information) Self management tools provided: copy of home glucose meter download Other plans:   I have advised patient and her son of symptoms of hypoglycemia to watch out for.  They will contact me if she has hypoglycemic symptoms or values < 90.  Otherwise, return to clinic in 3 months for Hgb A1c check.

## 2014-07-10 NOTE — Assessment & Plan Note (Signed)
Declines flu shot today.  Agreeable to referral to ophthalmology.  Will discuss PAP at next visit.

## 2014-07-10 NOTE — Patient Instructions (Signed)
1. Please return to see me in 3 months for follow-up.  [facility]  Please Stop taking Lovastatin and start taking Pravastatin.  Call me if you have problems with this medication.   2. Please take all medications as prescribed.     3. If you have worsening of your symptoms or new symptoms arise, please call the clinic [phone]), or go to the ER immediately if symptoms are severe.

## 2014-07-10 NOTE — Progress Notes (Signed)
   Subjective:    Patient ID: Maria BirchwoodFaduma Short, female    DOB: 12/10/1965, 48 y.o.   MRN: [id number]  HPI Comments: Maria Short is a 48 year old woman with DM type 2 (recently dx, Hgb A1c 15.09 January 2014) here for follow-up of DM.  Please see problem based assessment and plan for update of chronic conditions.     Review of Systems  Constitutional: Negative for fever, chills and appetite change.  Respiratory: Negative for shortness of breath.   Cardiovascular: Negative for chest pain.  Gastrointestinal: Negative for nausea, vomiting and diarrhea.  Endocrine: Negative for polydipsia and polyuria.       Objective:   Physical Exam  Constitutional: She is oriented to person, place, and time. She appears well-developed. No distress.  HENT:  Head: Normocephalic and atraumatic.  Eyes: EOM are normal. Pupils are equal, round, and reactive to light.  Cardiovascular: Normal rate, regular rhythm and normal heart sounds.  Exam reveals no gallop and no friction rub.   No murmur heard. Pulmonary/Chest: Effort normal and breath sounds normal. No respiratory distress. She has no wheezes.  Abdominal: Soft. Bowel sounds are normal. She exhibits no distension. There is no tenderness.  Musculoskeletal: Normal range of motion. She exhibits no edema or tenderness.  Neurological: She is alert and oriented to person, place, and time. No cranial nerve deficit.  Skin: Skin is warm. She is not diaphoretic.  Psychiatric: She has a normal mood and affect. Her behavior is normal.  Vitals reviewed.         Assessment & Plan:  Please see problem based assessment and plan

## 2014-07-12 NOTE — Progress Notes (Signed)
INTERNAL MEDICINE TEACHING ATTENDING ADDENDUM - Brittony Billick, MD: I reviewed and discussed at the time of visit with the resident Dr. Wilson, the patient's medical history, physical examination, diagnosis and results of pertinent tests and treatment and I agree with the patient's care as documented.  

## 2014-10-17 ENCOUNTER — Encounter: Payer: Self-pay | Admitting: *Deleted

## 2014-12-10 NOTE — Addendum Note (Signed)
Addended by: Neomia DearPOWERS, Sereena Marando E on: 12/10/2014 05:50 PM   Modules accepted: Orders

## 2015-01-27 ENCOUNTER — Ambulatory Visit (INDEPENDENT_AMBULATORY_CARE_PROVIDER_SITE_OTHER): Payer: Medicaid Other | Admitting: Internal Medicine

## 2015-01-27 VITALS — BP 117/66 | HR 64 | Temp 97.8°F | Ht 60.0 in | Wt 169.1 lb

## 2015-01-27 DIAGNOSIS — E119 Type 2 diabetes mellitus without complications: Secondary | ICD-10-CM

## 2015-01-27 DIAGNOSIS — E785 Hyperlipidemia, unspecified: Secondary | ICD-10-CM

## 2015-01-27 DIAGNOSIS — Z0000 Encounter for general adult medical examination without abnormal findings: Secondary | ICD-10-CM

## 2015-01-27 LAB — GLUCOSE, CAPILLARY: Glucose-Capillary: 166 mg/dL — ABNORMAL HIGH (ref 65–99)

## 2015-01-27 LAB — POCT GLYCOSYLATED HEMOGLOBIN (HGB A1C): HEMOGLOBIN A1C: 6.9

## 2015-01-27 MED ORDER — METFORMIN HCL ER 500 MG PO TB24: 500.0000 mg | ORAL_TABLET | Freq: Two times a day (BID) | ORAL | Status: DC

## 2015-01-27 MED ORDER — PRAVASTATIN SODIUM 40 MG PO TABS: 40.0000 mg | ORAL_TABLET | Freq: Every evening | ORAL | Status: DC

## 2015-01-27 NOTE — Patient Instructions (Signed)
Please STOP taking Glipizide and the Metformin 1000mg . I want you to start taking Metformin 500mg  XR, please take 2 pills every day (not just when the sugars are high)  General Instructions:   Please try to bring all your medicines next time. This will help [place] keep you safe from mistakes.   Progress Toward Treatment Goals:  No flowsheet data found.  Self Care Goals & Plans:  Self Care Goal 07/10/2014  Manage my medications take my medicines as prescribed; bring my medications to every visit; refill my medications on time  Monitor my health keep track of my blood glucose; bring my glucose meter and log to each visit  Eat healthy foods drink diet soda or water instead of juice or soda; eat more vegetables; eat foods that are low in salt; eat baked foods instead of fried foods; eat fruit for snacks and desserts  Be physically active -    No flowsheet data found.   Care Management & Community Referrals:  No flowsheet data found.

## 2015-01-27 NOTE — Assessment & Plan Note (Signed)
Lab Results  Component Value Date   HGBA1C 6.9 01/27/2015   HGBA1C 6.5 07/10/2014   HGBA1C 15.3* 01/15/2014     Assessment: Diabetes control: good control (HgbA1C at goal) Progress toward A1C goal:  unable to assess Comments: irregular compliance with medicine, some hypoglycemia (severe)  Plan: Medications:  D/C Metformin and glipizide, will start Metformin 500mg  XR BID to increase compliance Home glucose monitoring: Frequency: 2 times a day Timing: before breakfast, before dinner Instruction/counseling given: reminded to bring blood glucose meter & log to each visit, reminded to bring medications to each visit and discussed diet Educational resources provided:   Self management tools provided:   Other plans: I discussed referring her back to our diabetes educator but they replied that they do not want that now, she is eating poorly because of her fasting.  She apparently sees an eye doctor and [facility] is working on getting records, they report she is upto date. I am changing her DM to 500mg  XR metformin to reduce hypoglycemia and hopefully increase compliance, I have stressed taking the medication everyday and not just once the sugars are high.

## 2015-01-27 NOTE — Progress Notes (Signed)
[facility] Subjective:   Patient ID: Maria Short female   DOB: [birth year] 49 y.o.   MRN: [id number]  HPI: [name] is a 49 y.o. female with a PMH detailed below who presents for elevated sugars.  Her son is present and translates  T2DM: she is currently fasting during the month of Ramadan, in talking with her I gather that she sugars have been around 200 lately.  It appears that she does not consistently take Metformin (she has a bottle with her with about 15 pills from september 2015), and apparently waits for her sugars to get high to take a pill.  I gather that she takes half a pill ( ) as well as a glipizide.  She also reports some severe hypoglycemia (to the 20s!) which last happened about a month ago.    Past Medical History  Diagnosis Date  . Abdominal hernia     "doesn't hurt; doesn't bother me; right in the middle; above belly button" (01/14/2014)  . Type II diabetes mellitus dx'd 01/14/2014   Current Outpatient Prescriptions  Medication Sig Dispense Refill  . pravastatin (PRAVACHOL) 40 MG tablet Take 1 tablet (40 mg total) by mouth every evening. 90 tablet 3  . metFORMIN (GLUCOPHAGE XR) 500 MG 24 hr tablet Take 1 tablet (500 mg total) by mouth 2 (two) times daily. 90 tablet 3   No current facility-administered medications for this visit.   No family history on file. History   Social History  . Marital Status: Single    Spouse Name: N/A  . Number of Children: N/A  . Years of Education: N/A   Social History Main Topics  . Smoking status: Never Smoker   . Smokeless tobacco: Never Used  . Alcohol Use: No  . Drug Use: No  . Sexual Activity: No   Other Topics Concern  . Not on file   Social History Narrative   Review of Systems: Review of Systems  Constitutional: Negative for fever and malaise/fatigue.  Respiratory: Negative for shortness of breath.   Cardiovascular: Negative for chest pain.  Gastrointestinal: Negative for  abdominal pain.  Genitourinary: Negative for dysuria.     Objective:  Physical Exam: Filed Vitals:   01/27/15 0909  BP: 117/66  Pulse: 64  Temp: 97.8 F (36.6 C)  TempSrc: Oral  Height: 5' (1.524 m)  Weight: 169 lb 1.6 oz (76.703 kg)  SpO2: 100%  Physical Exam  Constitutional: She is well-developed, well-nourished, and in no distress.  Cardiovascular: Normal rate and regular rhythm.   Pulmonary/Chest: Effort normal and breath sounds normal.  Abdominal: Soft. Bowel sounds are normal.  Nursing note and vitals reviewed.   Assessment & Plan:  Case discussed with Dr. Heide Spark  Type 2 diabetes mellitus without complication Lab Results  Component Value Date   HGBA1C 6.9 01/27/2015   HGBA1C 6.5 07/10/2014   HGBA1C 15.3* 01/15/2014     Assessment: Diabetes control: good control (HgbA1C at goal) Progress toward A1C goal:  unable to assess Comments: irregular compliance with medicine, some hypoglycemia (severe)  Plan: Medications:  D/C Metformin and glipizide, will start Metformin  XR BID to increase compliance Home glucose monitoring: Frequency: 2 times a day Timing: before breakfast, before dinner Instruction/counseling given: reminded to bring blood glucose meter & log to each visit, reminded to bring medications to each visit and discussed diet Educational resources provided:   Self management tools provided:   Other plans: I discussed referring her back to our diabetes educator but they  replied that they do not want that now, she is eating poorly because of her fasting.  She apparently sees an eye doctor and [facility] is working on getting records, they report she is upto date. I am changing her DM to 500mg  XR metformin to reduce hypoglycemia and hopefully increase compliance, I have stressed taking the medication everyday and not just once the sugars are high.     Health care maintenance - Referral for mammogram - Needs colonoscopy but will takes things one at a  time.  Hyperlipidemia - Reports she ran out yesterday, suspect questionable compliance (given 3 months of Rx in December), I have refilled with 3 month Rx to hopefully increase compliance.    Medications Ordered Meds ordered this encounter  Medications  . metFORMIN (GLUCOPHAGE XR) 500 MG 24 hr tablet    Sig: Take 1 tablet (500 mg total) by mouth 2 (two) times daily.    Dispense:  90 tablet    Refill:  3    Please D/C Metformin 1g and Glipizide 5mg  Rx.  . pravastatin (PRAVACHOL) 40 MG tablet    Sig: Take 1 tablet (40 mg total) by mouth every evening.    Dispense:  90 tablet    Refill:  3   Other Orders Orders Placed This Encounter  Procedures  . MM DIGITAL SCREENING BILATERAL    EPIC ORDER PF: NONE  NO NEEDS  CR/MAMIE  MEDICAID 2D NO IMPLANTS/NO PROBS    Standing Status: Future     Number of Occurrences:      Standing Expiration Date: 03/28/2016    Order Specific Question:  Reason for Exam (SYMPTOM  OR DIAGNOSIS REQUIRED)    Answer:  screening    Order Specific Question:  Is the patient pregnant?    Answer:  No    Order Specific Question:  Preferred imaging location?    Answer:  [facility]  . Glucose, capillary  . POC Hbg A1C   Follow Up: Return in about 3 months (around 04/29/2015).

## 2015-01-27 NOTE — Assessment & Plan Note (Signed)
-   Reports she ran out yesterday, suspect questionable compliance (given 3 months of Rx in December), I have refilled with 3 month Rx to hopefully increase compliance.

## 2015-01-27 NOTE — Assessment & Plan Note (Signed)
-   Referral for mammogram - Needs colonoscopy but will takes things one at a time.

## 2015-01-28 NOTE — Progress Notes (Signed)
INTERNAL MEDICINE TEACHING ATTENDING ADDENDUM - Jerimey Burridge, MD: I reviewed and discussed at the time of visit with the resident Dr. Hoffman, the patient's medical history, physical examination, diagnosis and results of pertinent tests and treatment and I agree with the patient's care as documented.  

## 2015-02-05 ENCOUNTER — Ambulatory Visit
Admission: RE | Admit: 2015-02-05 | Discharge: 2015-02-05 | Disposition: A | Discharge status: Home / Self Care | Payer: Medicaid Other | Source: Ambulatory Visit | Attending: Internal Medicine | Admitting: Internal Medicine

## 2015-02-05 DIAGNOSIS — Z0000 Encounter for general adult medical examination without abnormal findings: Secondary | ICD-10-CM

## 2015-07-18 ENCOUNTER — Encounter: Payer: Self-pay | Admitting: Internal Medicine

## 2015-07-18 ENCOUNTER — Ambulatory Visit (INDEPENDENT_AMBULATORY_CARE_PROVIDER_SITE_OTHER): Payer: Medicaid Other | Admitting: Internal Medicine

## 2015-07-18 VITALS — BP 114/64 | HR 64 | Temp 97.7°F | Ht 60.0 in | Wt 167.2 lb

## 2015-07-18 DIAGNOSIS — Z794 Long term (current) use of insulin: Secondary | ICD-10-CM | POA: Diagnosis not present

## 2015-07-18 DIAGNOSIS — E119 Type 2 diabetes mellitus without complications: Secondary | ICD-10-CM

## 2015-07-18 DIAGNOSIS — M25552 Pain in left hip: Secondary | ICD-10-CM

## 2015-07-18 LAB — GLUCOSE, CAPILLARY: GLUCOSE-CAPILLARY: 165 mg/dL — AB (ref 65–99)

## 2015-07-18 LAB — POCT GLYCOSYLATED HEMOGLOBIN (HGB A1C): HEMOGLOBIN A1C: 6.5

## 2015-07-18 MED ORDER — IBUPROFEN 200 MG PO TABS: 800.0000 mg | ORAL_TABLET | Freq: Three times a day (TID) | ORAL | Status: AC | PRN

## 2015-07-18 NOTE — Progress Notes (Signed)
   Subjective:   Patient ID: Adolphus BirchwoodFaduma Obrien female   DOB: 08/01/1966 49 y.o.   MRN: [id number]  HPI: Ms.Kinga Molinaro is a 49 y.o. woman with past medical history as described below presenting with new onset of left hip pain. This pain has been intermittent for the past week and is worsened with standing from a seated position. She does not have any pain lying on her left side at night. She has no previous trauma to the hip. She has not had any weakness or fall associated with this hip pain. There is no radiation of the pain elsewhere.  See problem based assessment and plan below for additional details.   Past Medical History  Diagnosis Date  . Abdominal hernia     "doesn't hurt; doesn't bother me; right in the middle; above belly button" (01/14/2014)  . Type II diabetes mellitus (HCC) dx'd 01/14/2014   Current Outpatient Prescriptions  Medication Sig Dispense Refill  . metFORMIN (GLUCOPHAGE XR) 500 MG 24 hr tablet Take 1 tablet (500 mg total) by mouth 2 (two) times daily. 90 tablet 3  . pravastatin (PRAVACHOL) 40 MG tablet Take 1 tablet (40 mg total) by mouth every evening. 90 tablet 3   No current facility-administered medications for this visit.   No family history on file. Social History   Social History  . Marital Status: Single    Spouse Name: N/A  . Number of Children: N/A  . Years of Education: N/A   Social History Main Topics  . Smoking status: Never Smoker   . Smokeless tobacco: Never Used  . Alcohol Use: No  . Drug Use: No  . Sexual Activity: No   Other Topics Concern  . None   Social History Narrative   Review of Systems: Review of Systems  Constitutional: Negative for fever and chills.  Eyes: Negative for blurred vision and double vision.  Cardiovascular: Negative for claudication and leg swelling.  Gastrointestinal: Negative for abdominal pain.  Genitourinary: Negative for flank pain.  Musculoskeletal: Positive for joint pain. Negative for back pain and falls.    Neurological: Negative for focal weakness and weakness.    Objective:  Physical Exam: Filed Vitals:   07/18/15 1034  BP: 114/64  Pulse: 64  Temp: 97.7 F (36.5 C)  TempSrc: Oral  Height: 5' (1.524 m)  Weight: 167 lb 3.2 oz (75.841 kg)  SpO2: 100%   GENERAL- alert, co-operative, NAD HEENT- Atraumatic, missing incisors, oral mucosa appears moist CARDIAC- RRR, no murmurs, rubs or gallops. RESP- CTAB, no wheezes or crackles. BACK- Normal curvature, no paraspinal tenderness NEURO- strength in lower extremities- 5/5 bilaterally, Sensation intact- globally EXTREMITIES- symmetric, no pedal edema, mild tenderness to palpation over the head of the greater trochanter, pain not reproducible on ROM. SKIN- Warm, dry, No rash or lesion. PSYCH- Normal mood and affect, appropriate thought content and speech.  Assessment & Plan:

## 2015-07-18 NOTE — Patient Instructions (Signed)
Your left hip pain is most likely from inflammation of the muscle or tendons. We recommend taking over the counter ibuprofen 800mg  up to 3 times daily as needed for mild to moderate pain for the next 2 weeks. I also recommend stretching your legs and hips 3 times per day if possible as this may decrease the amount of strain. If your pain does not improve or gets worse over this time please call us back at [phone].  Your diabetes is well controlled today, great job with eating a healthy diet and being active!

## 2015-07-19 LAB — MICROALBUMIN / CREATININE URINE RATIO
Creatinine, Urine: 21.6 mg/dL
Microalbumin, Urine: <3 ug/mL

## 2015-07-20 NOTE — Assessment & Plan Note (Signed)
Lab Results  Component Value Date   HGBA1C 6.5 07/18/2015   HGBA1C 6.9 01/27/2015   HGBA1C 6.5 07/10/2014     Assessment: Diabetes control: Controlled Progress toward A1C goal:  At goal Comments: Good A1c, she is taking her metformin 500mg  BID without difficulty, without hypoglycemic episodes, optho exam is within past 6 months, urine microalbumin is WNL today  Plan: Medications:  continue current medications Instruction/counseling given: reminded to bring medications to each visit and discussed diet

## 2015-07-20 NOTE — Assessment & Plan Note (Signed)
Assessment: New left hip pain that is relatively mild and does not prevent any of her usual daily activity. On exam this pain is most consistent with soft tissue inflammation. Pain is cramping in nature without radiation, numbness, or weakness in the leg. Tenderness is somewhat more distal than would be characteristic of trochanteric bursitis. She does not have any apparent involvement of the true hip joint.  Plan: Ibuprofen 800mg  q8hrs PRN Recommended stretching of the hip Heat pack as needed Callback instruction of symptoms worsen or are not resolving with supportive measures

## 2015-07-21 NOTE — Progress Notes (Signed)
Internal Medicine Clinic Attending  I saw and evaluated the patient.  I personally confirmed the key portions of the history and exam documented by Dr. Dimple Caseyice and I reviewed pertinent patient test results.  The assessment, diagnosis, and plan were formulated together and I agree with the documentation in the resident's note.  Based on exam I doubt acetabular OA or femoral head pathology. Rather the pain seems more consistent with greater trochanteric pain syndrome or possibly IT bad tendinitis. Plan for conservative trial with NSAIDs and stretching for several weeks. We can consider a bursa steroid injection if no improvement.

## 2015-10-20 ENCOUNTER — Ambulatory Visit (INDEPENDENT_AMBULATORY_CARE_PROVIDER_SITE_OTHER): Payer: Self-pay | Admitting: Internal Medicine

## 2015-10-20 ENCOUNTER — Encounter: Payer: Self-pay | Admitting: Internal Medicine

## 2015-10-20 VITALS — BP 118/70 | HR 72 | Temp 97.5°F | Ht 60.0 in | Wt 175.6 lb

## 2015-10-20 DIAGNOSIS — Z7984 Long term (current) use of oral hypoglycemic drugs: Secondary | ICD-10-CM

## 2015-10-20 DIAGNOSIS — E119 Type 2 diabetes mellitus without complications: Secondary | ICD-10-CM

## 2015-10-20 DIAGNOSIS — E1165 Type 2 diabetes mellitus with hyperglycemia: Secondary | ICD-10-CM

## 2015-10-20 LAB — POCT GLYCOSYLATED HEMOGLOBIN (HGB A1C): HEMOGLOBIN A1C: 7.7

## 2015-10-20 LAB — GLUCOSE, CAPILLARY: Glucose-Capillary: 88 mg/dL (ref 65–99)

## 2015-10-20 MED ORDER — METFORMIN HCL ER (OSM) 1000 MG PO TB24: 1000.0000 mg | ORAL_TABLET | Freq: Every day | ORAL | Status: DC

## 2015-10-20 NOTE — Assessment & Plan Note (Signed)
Her a1c in December was 6.5 but is up to 7.7 today. She's been taking metformin XR 500mg  daily without any problems. Today I've increased her dose to metformin XR 1000mg  daily and I've asked her to continue checking her sugars so we can consider adding another medication at the next visit in 1 month should she remain hyperglycemic. She's had hypoglycemic episodes on sulfonylureas so I think a different oral agent such as an SGLT-2 inhibitor may be a good option if she's able to get insurance.

## 2015-10-20 NOTE — Patient Instructions (Signed)
[name],  We've increased your metformin from 500mg  to 1000mg  daily. Keep checking your sugars in the morning and remember to bring your sugar meter at the next visit so we can make adjustments to your medications.  Take care; we'll see you in 1 month, Dr. Earnest ConroyFlores

## 2015-10-20 NOTE — Progress Notes (Signed)
Patient ID: Maria BirchwoodFaduma Short, female   DOB: 06/05/1966, 50 y.o.   MRN: [id number] [facility] Subjective:   Patient ID: Maria BirchwoodFaduma Short female   DOB: 02/20/1966 50 y.o.   MRN: [id number]  HPI: [name] is a 50 y.o. [place] female with type 2 diabetes presenting to clinic for type 2 diabetes follow-up.  Type 2 diabetes: She had an elevated sugar of 320 today after eating lunch and was quite concerned about the value. She's been taking metformin 500mg  XR once daily and denies side effects. She has gained about 20 pounds in the last 2 years.  She is not a smoker and I have reviewed her medications with her today.  Please see the assessment and plan for the status of the patient's chronic medical problems.  Review of Systems  Constitutional: Negative for fever, chills and weight loss.  Respiratory: Negative for shortness of breath.   Cardiovascular: Negative for leg swelling.  Gastrointestinal: Negative for abdominal pain.  Neurological: Negative for headaches.   Objective:  Physical Exam: Filed Vitals:   10/20/15 1414  BP: 118/70  Pulse: 72  Temp: 97.5 F (36.4 C)  TempSrc: Oral  Height: 5' (1.524 m)  Weight: 175 lb 9.6 oz (79.652 kg)  SpO2: 98%   General: resting in chair comfortably, using interpreter Cardiac: regular rate and rhythm, no rubs, murmurs or gallops Pulm: breathing well, clear to auscultation bilaterally Ext: warm and well perfused, without pedal edema  Assessment & Plan:  Case discussed with Dr. Rogelia BogaButcher  Type 2 diabetes mellitus without complication Her a1c in December was 6.5 but is up to 7.7 today. She's been taking metformin XR 500mg  daily without any problems. Today I've increased her dose to metformin XR 1000mg  daily and I've asked her to continue checking her sugars so we can consider adding another medication at the next visit in 1 month should she remain hyperglycemic. She's had hypoglycemic episodes on sulfonylureas so I think a  different oral agent such as an SGLT-2 inhibitor may be a good option if she's able to get insurance.   Medications Ordered Meds ordered this encounter  Medications  . metformin (FORTAMET) 1000 MG (OSM) 24 hr tablet    Sig: Take 1 tablet (1,000 mg total) by mouth daily with breakfast.    Dispense:  90 tablet    Refill:  3   Other Orders Orders Placed This Encounter  Procedures  . Glucose, capillary  . POC Hbg A1C   Follow Up: Return in about 4 weeks (around 11/17/2015).

## 2015-10-21 NOTE — Progress Notes (Signed)
Internal Medicine Clinic Attending  Case discussed with Dr. Flores at the time of the visit.  We reviewed the resident's history and exam and pertinent patient test results.  I agree with the assessment, diagnosis, and plan of care documented in the resident's note. 

## 2016-01-21 ENCOUNTER — Encounter: Payer: Self-pay | Admitting: *Deleted

## 2016-01-21 ENCOUNTER — Encounter: Payer: Medicaid Other | Admitting: Internal Medicine

## 2016-02-25 ENCOUNTER — Ambulatory Visit (INDEPENDENT_AMBULATORY_CARE_PROVIDER_SITE_OTHER): Payer: Medicaid Other | Admitting: Internal Medicine

## 2016-02-25 VITALS — BP 122/68 | HR 74 | Temp 98.1°F | Ht 60.0 in | Wt 174.1 lb

## 2016-02-25 DIAGNOSIS — M79671 Pain in right foot: Secondary | ICD-10-CM

## 2016-02-25 DIAGNOSIS — Z7984 Long term (current) use of oral hypoglycemic drugs: Secondary | ICD-10-CM | POA: Diagnosis not present

## 2016-02-25 DIAGNOSIS — E119 Type 2 diabetes mellitus without complications: Secondary | ICD-10-CM | POA: Diagnosis present

## 2016-02-25 DIAGNOSIS — Z6834 Body mass index (BMI) 34.0-34.9, adult: Secondary | ICD-10-CM

## 2016-02-25 DIAGNOSIS — M79672 Pain in left foot: Secondary | ICD-10-CM | POA: Diagnosis not present

## 2016-02-25 DIAGNOSIS — M25579 Pain in unspecified ankle and joints of unspecified foot: Secondary | ICD-10-CM | POA: Insufficient documentation

## 2016-02-25 DIAGNOSIS — Z23 Encounter for immunization: Secondary | ICD-10-CM | POA: Diagnosis not present

## 2016-02-25 DIAGNOSIS — E785 Hyperlipidemia, unspecified: Secondary | ICD-10-CM

## 2016-02-25 LAB — POCT GLYCOSYLATED HEMOGLOBIN (HGB A1C): HEMOGLOBIN A1C: 8

## 2016-02-25 LAB — GLUCOSE, CAPILLARY: Glucose-Capillary: 111 mg/dL — ABNORMAL HIGH (ref 65–99)

## 2016-02-25 MED ORDER — GABAPENTIN 300 MG PO CAPS: 300.0000 mg | ORAL_CAPSULE | Freq: Three times a day (TID) | ORAL | Status: DC

## 2016-02-25 MED ORDER — METFORMIN HCL ER (OSM) 1000 MG PO TB24: 1000.0000 mg | ORAL_TABLET | Freq: Every day | ORAL | Status: DC

## 2016-02-25 MED ORDER — PRAVASTATIN SODIUM 40 MG PO TABS: 40.0000 mg | ORAL_TABLET | Freq: Every evening | ORAL | Status: DC

## 2016-02-25 MED ORDER — SITAGLIPTIN PHOSPHATE 100 MG PO TABS: 100.0000 mg | ORAL_TABLET | Freq: Every day | ORAL | Status: DC

## 2016-02-25 NOTE — Patient Instructions (Signed)
Diabetes and Standards of Medical Care Diabetes is complicated. You may find that your diabetes team includes a dietitian, nurse, diabetes educator, eye doctor, and more. To help everyone know what is going on and to help you get the care you deserve, the following schedule of care was developed to help keep you on track. Below are the tests, exams, vaccines, medicines, education, and plans you will need. HbA1c test This test shows how well you have controlled your glucose over the past 2-3 months. It is used to see if your diabetes management plan needs to be adjusted.   It is performed at least 2 times a year if you are meeting treatment goals.  It is performed 4 times a year if therapy has changed or if you are not meeting treatment goals. Blood pressure test  This test is performed at every routine medical visit. The goal is less than 140/90 mm Hg for most people, but 130/80 mm Hg in some cases. Ask your health care provider about your goal. Dental exam  Follow up with the dentist regularly. Eye exam  If you are diagnosed with type 1 diabetes as a child, get an exam upon reaching the age of 10 years or older and having had diabetes for 3-5 years. Yearly eye exams are recommended after that initial eye exam.  If you are diagnosed with type 1 diabetes as an adult, get an exam within 5 years of diagnosis and then yearly.  If you are diagnosed with type 2 diabetes, get an exam as soon as possible after the diagnosis and then yearly. Foot care exam  Visual foot exams are performed at every routine medical visit. The exams check for cuts, injuries, or other problems with the feet.  You should have a complete foot exam performed every year. This exam includes an inspection of the structure and skin of your feet, a check of the pulses in your feet, and a check of the sensation in your feet.  Type 1 diabetes: The first exam is performed 5 years after diagnosis.  Type 2 diabetes: The first  exam is performed at the time of diagnosis.  Check your feet nightly for cuts, injuries, or other problems with your feet. Tell your health care provider if anything is not healing. Kidney function test (urine microalbumin)  This test is performed once a year.  Type 1 diabetes: The first test is performed 5 years after diagnosis.  Type 2 diabetes: The first test is performed at the time of diagnosis.  A serum creatinine and estimated glomerular filtration rate (eGFR) test is done once a year to assess the level of chronic kidney disease (CKD), if present. Lipid profile (cholesterol, HDL, LDL, triglycerides)  Performed every 5 years for most people.  The goal for LDL is less than 100 mg/dL. If you are at high risk, the goal is less than 70 mg/dL.  The goal for HDL is 40 mg/dL-50 mg/dL for men and 50 mg/dL-60 mg/dL for women. An HDL cholesterol of 60 mg/dL or higher gives some protection against heart disease.  The goal for triglycerides is less than 150 mg/dL. Immunizations  The flu (influenza) vaccine is recommended yearly for every person 6 months of age or older who has diabetes.  The pneumonia (pneumococcal) vaccine is recommended for every person 2 years of age or older who has diabetes. Adults 65 years of age or older may receive the pneumonia vaccine as a series of two separate shots.  The hepatitis B   vaccine is recommended for adults shortly after they have been diagnosed with diabetes.  The Tdap (tetanus, diphtheria, and pertussis) vaccine should be given:  According to normal childhood vaccination schedules, for children.  Every 10 years, for adults who have diabetes. Diabetes self-management education  Education is recommended at diagnosis and ongoing as needed. Treatment plan  Your treatment plan is reviewed at every medical visit.   This information is not intended to replace advice given to you by your health care provider. Make sure you discuss any questions you  have with your health care provider.   Document Released: 05/23/2009 Document Revised: 08/16/2014 Document Reviewed: 12/26/2012 Elsevier Interactive Patient Education 2016 Elsevier Inc.  

## 2016-02-25 NOTE — Assessment & Plan Note (Addendum)
Patient presents today for follow-up of her type 2 diabetes. She is very compliant with her medication currently at 1000 mg extended release metformin. She notes that her daily blood glucoses average ~150. Sometimes her her blood glucose is elevated near 200. And over the last month she has noted one episode of hypoglycemia where she felt sick and nauseous. At this time she checked her blood sugar sugar and it was 91. She knows well the procedure for hypoglycemic episodes and immediately took sugary food. She felt better after this.  She is agreeable to adding additional medication if necessary to control her blood glucose. Hemoglobin A1c in the clinic today was 8.0. I will start Januvia for better BG control. We will also follow her renal function with a BMP. And in light of her diabetes we will assess her cardiac risk with a lipid panel. He is currently on moderate intensity statin therapy with pravastatin 40 mg. We will plan to continue this therapy today unless otherwise indicated by her 10 year cardiovascular risk score.  She does note some signs of neuropathic pain in her feet please see the assessment and plan for pain and foot below. We will trial Gabapentin.  UPDATE: Lipid panel demonstrates 10 yr ASCVD risk score of 3.0% which is appropriately managed with current moderate intensity statin therapy.

## 2016-02-25 NOTE — Assessment & Plan Note (Addendum)
Patient is a 4 days ago she started expensive burning sensations on the bottom of both of her feeds. This pain is limited to the soles of her feet and is intermittent in nature. She reports that she has not tried anything for this pain. It is worse at rest and when lying down for sleep. It is not worsened by movement and is not tender to palpation. This pain is nonradiating. She denies weakness to her lower extremities, she denies changes to sensation in her lower extremities. Her diabetic foot exam is normal. There is no signs of visible rash or other changes to the skin.  I am worried that this pain may be related to neuropathic pain in light of her diabetes. We will try a small dose of gabapentin 300 mg 3 times a day. Plan follow-up as to the effectiveness of this medication at her next visit.

## 2016-02-25 NOTE — Progress Notes (Signed)
   CC: Follow-up diabetes mellitus HPI: Ms. Adolphus BirchwoodFaduma Short is a 50 y.o. female with a h/o of type 2 diabetes, hyperlipidemia who presents with four-day history of bilateral pain in her feet and follow-up of diabetes mellitus.  Please see Problem-based charting for HPI.  Past Medical History  Diagnosis Date  . Abdominal hernia     "doesn't hurt; doesn't bother me; right in the middle; above belly button" (01/14/2014)  . Type II diabetes mellitus (HCC) dx'd 01/14/2014   Current Outpatient Rx  Name  Route  Sig  Dispense  Refill  . gabapentin (NEURONTIN) 300 MG capsule   Oral   Take 1 capsule (300 mg total) by mouth 3 (three) times daily.   90 capsule   2   . ibuprofen (ADVIL) 200 MG tablet   Oral   Take 4 tablets (800 mg total) by mouth every 8 (eight) hours as needed for mild pain or moderate pain.   100 tablet   2   . metformin (FORTAMET) 1000 MG (OSM) 24 hr tablet   Oral   Take 1 tablet (1,000 mg total) by mouth daily with breakfast.   90 tablet   3   . pravastatin (PRAVACHOL) 40 MG tablet   Oral   Take 1 tablet (40 mg total) by mouth every evening.   90 tablet   3    Social Hx: Nurse, learning disabilityTranslator used.  Review of Systems: ROS in HPI. Otherwise, denies CP, SOB, GI symptoms, Urinary symptoms, MSK symptoms.  Physical Exam: Filed Vitals:   02/25/16 1404  BP: 122/68  Pulse: 74  Temp: 98.1 F (36.7 C)  TempSrc: Oral  Height: 5' (1.524 m)  Weight: 174 lb 1.6 oz (78.971 kg)  SpO2: 100%   General appearance: alert, cooperative, appears stated age and no distress Head: Normocephalic, without obvious abnormality, atraumatic Lungs: clear to auscultation bilaterally Heart: regular rate and rhythm, S1, S2 normal, no murmur, click, rub or gallop Abdomen: soft, non-tender; bowel sounds normal; no masses,  no organomegaly Extremities: extremities normal, atraumatic, no cyanosis or edema and foot exam wnl, no rash or TTP on soles of feet, ROM intact.  Assessment & Plan:  See  encounters tab for problem based medical decision making. Patient seen with Dr. Cleda DaubE. Hoffman  Signed: Carolynn CommentBryan Cieara Stierwalt, MD 02/25/2016, 3:04 PM  Pager: [phone]

## 2016-02-26 LAB — BASIC METABOLIC PANEL
BUN/Creatinine Ratio: 13 (ref 9–23)
BUN: 9 mg/dL (ref 6–24)
CALCIUM: 9.3 mg/dL (ref 8.7–10.2)
CHLORIDE: 100 mmol/L (ref 96–106)
CO2: 25 mmol/L (ref 18–29)
Creatinine, Ser: 0.7 mg/dL (ref 0.57–1.00)
GFR calc Af Amer: 116 mL/min/{1.73_m2} (ref >59)
GFR calc non Af Amer: 101 mL/min/{1.73_m2} (ref >59)
GLUCOSE: 112 mg/dL — AB (ref 65–99)
POTASSIUM: 3.7 mmol/L (ref 3.5–5.2)
SODIUM: 142 mmol/L (ref 134–144)

## 2016-02-26 LAB — LIPID PANEL
CHOLESTEROL TOTAL: 206 mg/dL — AB (ref 100–199)
Chol/HDL Ratio: 4.4 ratio units (ref 0.0–4.4)
HDL: 47 mg/dL (ref >39)
LDL Calculated: 121 mg/dL — ABNORMAL HIGH (ref 0–99)
TRIGLYCERIDES: 189 mg/dL — AB (ref 0–149)
VLDL Cholesterol Cal: 38 mg/dL (ref 5–40)

## 2016-02-26 LAB — MICROALBUMIN / CREATININE URINE RATIO
Creatinine, Urine: 83.1 mg/dL
Microalbumin, Urine: <3 ug/mL

## 2016-02-27 ENCOUNTER — Other Ambulatory Visit: Payer: Self-pay | Admitting: Internal Medicine

## 2016-02-27 DIAGNOSIS — E119 Type 2 diabetes mellitus without complications: Secondary | ICD-10-CM

## 2016-02-27 NOTE — Progress Notes (Signed)
Internal Medicine Clinic Attending  I saw and evaluated the patient.  I personally confirmed the key portions of the history and exam documented by Dr. Strelow and I reviewed pertinent patient test results.  The assessment, diagnosis, and plan were formulated together and I agree with the documentation in the resident's note. 

## 2016-02-27 NOTE — Addendum Note (Signed)
Addended by: Carlynn PurlHOFFMAN, Archibald Marchetta C on: 02/27/2016 11:47 AM   Modules accepted: Level of Service

## 2016-07-12 ENCOUNTER — Encounter (INDEPENDENT_AMBULATORY_CARE_PROVIDER_SITE_OTHER): Payer: Self-pay

## 2016-07-12 ENCOUNTER — Telehealth: Payer: Self-pay | Admitting: *Deleted

## 2016-07-12 ENCOUNTER — Ambulatory Visit (INDEPENDENT_AMBULATORY_CARE_PROVIDER_SITE_OTHER): Payer: Medicaid Other | Admitting: Internal Medicine

## 2016-07-12 VITALS — BP 118/65 | HR 66 | Temp 97.4°F | Ht 60.0 in | Wt 165.7 lb

## 2016-07-12 DIAGNOSIS — E1165 Type 2 diabetes mellitus with hyperglycemia: Secondary | ICD-10-CM | POA: Diagnosis not present

## 2016-07-12 DIAGNOSIS — Z7984 Long term (current) use of oral hypoglycemic drugs: Secondary | ICD-10-CM

## 2016-07-12 DIAGNOSIS — E1142 Type 2 diabetes mellitus with diabetic polyneuropathy: Secondary | ICD-10-CM

## 2016-07-12 DIAGNOSIS — Z9114 Patient's other noncompliance with medication regimen: Secondary | ICD-10-CM

## 2016-07-12 DIAGNOSIS — E1140 Type 2 diabetes mellitus with diabetic neuropathy, unspecified: Secondary | ICD-10-CM | POA: Insufficient documentation

## 2016-07-12 DIAGNOSIS — E785 Hyperlipidemia, unspecified: Secondary | ICD-10-CM | POA: Diagnosis not present

## 2016-07-12 DIAGNOSIS — E119 Type 2 diabetes mellitus without complications: Secondary | ICD-10-CM | POA: Insufficient documentation

## 2016-07-12 DIAGNOSIS — Z79899 Other long term (current) drug therapy: Secondary | ICD-10-CM | POA: Diagnosis not present

## 2016-07-12 LAB — POCT GLYCOSYLATED HEMOGLOBIN (HGB A1C): Hemoglobin A1C: 13.6

## 2016-07-12 LAB — GLUCOSE, CAPILLARY: Glucose-Capillary: 396 mg/dL — ABNORMAL HIGH (ref 65–99)

## 2016-07-12 MED ORDER — GABAPENTIN 300 MG PO CAPS: 300.0000 mg | ORAL_CAPSULE | Freq: Three times a day (TID) | ORAL | 2 refills | Status: DC

## 2016-07-12 NOTE — Telephone Encounter (Signed)
WALK IN PT IS ACCOMPANIED BY SOMEONE. THAT PERSON STATES PT IS WORRIED because her glucose is elevated sometimes, has not been seen since July, [facility] She was to return in october

## 2016-07-12 NOTE — Progress Notes (Signed)
Internal Medicine Clinic Attending  I saw and evaluated the patient.  I personally confirmed the key portions of the history and exam documented by Dr. Vincente LibertyMolt and I reviewed pertinent patient test results.  The assessment, diagnosis, and plan were formulated together and I agree with the documentation in the resident's note.  If prior authorization problematic, could consider another class of medication as second line or contact insurance to find out which DPP4 inhibitor is preferred.

## 2016-07-12 NOTE — Assessment & Plan Note (Signed)
HbA1c today 13.6%, was 8% in July 2017. This is likely due to medication non-compliance. Pt endorsed taking 2 diabetes medications per day however when calling the pharmacy to verify that she had bee picking up her medications on schedule, she had not picked up Januvia since July. Unfortunately prior auth is required for this medication. This was discussed with the patient and her interpretor. I obtained both of their phone numbers to call once prior authorization is received so that they know [facility] [address]  -Continue metformin 1000mg  daily -Start Januvia 100 mg once prior auth received.  -To call both patient and interpretor ([place]: # [phone]) once prior auth received.

## 2016-07-12 NOTE — Assessment & Plan Note (Signed)
Went over daily medication list several times with patient. In addition, typed out list on AVS for reference at home. Encouraged patient to bring medications with her to her f/u appt in 1 month.

## 2016-07-12 NOTE — Assessment & Plan Note (Signed)
Reports compliance with Atorvastatin 40 mg daily. -Continue

## 2016-07-12 NOTE — Progress Notes (Signed)
   CC: elevated blood sugars, follow up of medications.  HPI:  [name] is a 50 y.o. F with Mhx significant for DM2 complicated by peripheral neuropathy, and HLD who presents for evaluation of elevated blood sugars and follow up of recently started medications. Pt reports that since October her blood sugars have been creeping up from their usual 130's to now 500's consistently. She endorses compliance of her medications (Metformin 1000 mg daily and Januvia 100 mg daily which she was started on 02/2016). She endorses increased thirst and increased urinary frequency. Also reports she is feeling more tired than usual.  She was started on Gabapentin 300 mg TID at her last visit for complaints consistent with BL neuropathic foot pain. She reports this is helping her pain significantly.   Pt reports she has been stressed recently after the death of her mother and brother over the past 3 months. She reports she is coping well however has neglected her care for a little while.   Pt does not speak english, evaluation was conducted with the help of an interpreter.   Past Medical History:  Diagnosis Date  . Abdominal hernia    "doesn't hurt; doesn't bother me; right in the middle; above belly button" (01/14/2014)  . Type II diabetes mellitus (HCC) dx'd 01/14/2014    Review of Systems: Review of Systems  Constitutional: Positive for malaise/fatigue. Negative for chills, fever and weight loss.  Respiratory: Negative for cough, shortness of breath and wheezing.   Cardiovascular: Negative for chest pain and leg swelling.  Gastrointestinal: Negative for abdominal pain, constipation, diarrhea, nausea and vomiting.  Genitourinary: Positive for urgency. Negative for dysuria and frequency.  Musculoskeletal: Negative for myalgias.  Neurological: Positive for weakness. Negative for sensory change and headaches.     Physical Exam: Physical Exam  Constitutional: She is well-developed, well-nourished, and  in no distress.  HENT:  Head: Normocephalic and atraumatic.  Eyes: Pupils are equal, round, and reactive to light. No scleral icterus.  Cardiovascular: Normal rate, regular rhythm and intact distal pulses.   Pulmonary/Chest: Effort normal and breath sounds normal. No respiratory distress.  Abdominal: Soft. Bowel sounds are normal. She exhibits no distension. There is no tenderness.  Neurological: She is alert. She exhibits normal muscle tone.  Skin: Skin is warm and dry. She is not diaphoretic.  Psychiatric: Mood, memory, affect and judgment normal.  Very pleasant.     Vitals:   07/12/16 1043  BP: 118/65  Pulse: 66  Temp: 97.4 F (36.3 C)  TempSrc: Oral  SpO2: 100%  Weight: 165 lb 11.2 oz (75.2 kg)  Height: 5' (1.524 m)    Assessment & Plan:   See Encounters Tab for problem based charting.  Patient seen with Dr. Criselda PeachesMullen

## 2016-07-12 NOTE — Assessment & Plan Note (Signed)
Pt started on Gabapentin 300 mg TID in July for complaints consistent with diabetic neuropathy of BL feet. Reports improvement in her symptoms since beginning these medications.  -Continue Gabapentin 300 mg TID, Refill sent to pharmacy

## 2016-07-12 NOTE — Patient Instructions (Addendum)
It was an absolute pleasure meeting you today! I hope that we can get your blood sugar under control with the correct medications.  1. Please continue taking METFORMIN 1000 MG DAILY.  2. We are working on getting authorization for you to start JANUVIA 100 MG DAILY. This usually requires 1-2 days to be approved by your insurance. [facility] on [address], but this might not be until Wednesday of this week.  3. I am glad the Gabapentin is working for your foot pain. Please continue taking this medication 3 times a day.  4. Please continue checking your blood sugar regularly and pease see us in the clinic in 1 month to follow up your blood sugar readings.   MEDICATION OVERVIEW (please ensure you are taking all 3 of these medications daily): -Metformin 1000 mg once daily -Januvia 100 mg once daily (once approved by your insurance).  -Pravastatin 40 mg once daily -Gabapentin 300 mg three times daily.

## 2016-07-16 ENCOUNTER — Ambulatory Visit: Payer: Medicaid Other

## 2016-07-16 ENCOUNTER — Telehealth: Payer: Self-pay | Admitting: *Deleted

## 2016-07-16 NOTE — Telephone Encounter (Signed)
Pt's daughter calls and ask about medicine that dr molt was to call her about, per dr molt it is a PA. She will be notified when the answer arrives. Daughter then states what should she do about pt's hi blood sugar- 500, appt is made for 1315 today ACC

## 2016-07-16 NOTE — Telephone Encounter (Signed)
No PA yet! Again, will call patient AND her daughter when received. IF pt continues to have significant hyperglycemia, she may need to be started on Insulin at her next visit.

## 2016-07-19 ENCOUNTER — Other Ambulatory Visit: Payer: Self-pay | Admitting: Pharmacist

## 2016-07-19 ENCOUNTER — Ambulatory Visit (INDEPENDENT_AMBULATORY_CARE_PROVIDER_SITE_OTHER): Payer: Medicaid Other | Admitting: Internal Medicine

## 2016-07-19 ENCOUNTER — Ambulatory Visit (INDEPENDENT_AMBULATORY_CARE_PROVIDER_SITE_OTHER): Payer: Medicaid Other | Admitting: Dietician

## 2016-07-19 VITALS — BP 127/67 | HR 75 | Temp 98.0°F | Wt 166.8 lb

## 2016-07-19 DIAGNOSIS — E1165 Type 2 diabetes mellitus with hyperglycemia: Secondary | ICD-10-CM

## 2016-07-19 DIAGNOSIS — Z79899 Other long term (current) drug therapy: Secondary | ICD-10-CM

## 2016-07-19 DIAGNOSIS — Z7984 Long term (current) use of oral hypoglycemic drugs: Secondary | ICD-10-CM

## 2016-07-19 DIAGNOSIS — E119 Type 2 diabetes mellitus without complications: Secondary | ICD-10-CM

## 2016-07-19 DIAGNOSIS — Z713 Dietary counseling and surveillance: Secondary | ICD-10-CM

## 2016-07-19 DIAGNOSIS — E1142 Type 2 diabetes mellitus with diabetic polyneuropathy: Secondary | ICD-10-CM

## 2016-07-19 DIAGNOSIS — M25579 Pain in unspecified ankle and joints of unspecified foot: Secondary | ICD-10-CM

## 2016-07-19 MED ORDER — METFORMIN HCL 500 MG PO TABS: 500.0000 mg | ORAL_TABLET | Freq: Three times a day (TID) | ORAL | 11 refills | Status: DC

## 2016-07-19 NOTE — Assessment & Plan Note (Addendum)
Hemoglobin A1c last week 13.6%, was 8% July 2017. She does have history of hemoglobin A1c greater than 14%. Charts reports compliance with her 1000 mg metformin daily and endorsed a intolerance to metformin 2000 mg daily. Patient still does not have Januvia secondary to insurance issues. Review of blood sugar logs for the past 2 weeks shows that all blood sugars have been ranging from 400-500. -Patient will meet with our nutritionist for diabetic counseling in regards to how she can better manage her diet -Have consulted our pharmacist in the clinic to assist us with obtaining Januvia. She is currently obtaining prior authorization and patient should be approved by the end of the day will be able to pick up her medication tomorrow -Increase metformin to 500 mg 3 times a day. Monitor for tolerance

## 2016-07-19 NOTE — Patient Instructions (Signed)
It was an absolute pleasure seeing you again! Thank you for brining your blood sugar readings to this appointment.  1. Today we talked about your diabetes. I have some good news, we were able to get your Januvia approved today! Please pick up this medication tomorrow morning and begin it tomorrow. It will be at [facility].  2. Today you met with out diabetic coordinator. I hope that she was able to help you make better decisions to help improve your diabetes. 3. I have increased your metformin to 500 mg three times a day. 4. Please take the remainder of your medications as directed.  5. Please see [place] again in 1 month so we can follow up your blood sugars! 6. Have a great new year!

## 2016-07-19 NOTE — Assessment & Plan Note (Signed)
Patient was burning sensations when her blood sugar is elevated to this degree. Reports improves when taking her gabapentin. Encouraged compliance with his medication and noted that better glycemic control likely improve this. -Continue gabapentin 300 mg 3 times a day

## 2016-07-19 NOTE — Progress Notes (Signed)
   CC: follow up of hyperglycemia.   HPI:  [name] is a 50 y.o.  female with medical history significant for poorly controlled type 2 diabetes controlled by peripheral neuropathy and hyperlipidemia who presents for follow-up evaluation of hyperglycemia. Patient presents with her daughter who assisted in translation. Brought in a printout of her blood glucose log book showing average blood sugar in the 500s over the past several weeks. She quit compliance with her daily metformin and has not received Januvia yet due to insurance issues. She presents here with her daughter out of concern of her persistently elevated high blood sugar.  Past Medical History:  Diagnosis Date  . Abdominal hernia    "doesn't hurt; doesn't bother me; right in the middle; above belly button" (01/14/2014)  . Type II diabetes mellitus (HCC) dx'd 01/14/2014    Review of Systems:  Review of Systems  Constitutional: Negative for chills, fever and weight loss.  Eyes: Negative for blurred vision and double vision.  Cardiovascular: Negative for chest pain and leg swelling.  Gastrointestinal: Negative for abdominal pain, nausea and vomiting.  Genitourinary: Negative for dysuria, frequency and urgency.  Neurological: Negative for dizziness and headaches.  Endo/Heme/Allergies: Positive for polydipsia.   Physical Exam: Physical Exam  Constitutional: She is well-developed, well-nourished, and in no distress.  HENT:  Head: Normocephalic and atraumatic.  Eyes: Right eye exhibits no discharge. Left eye exhibits no discharge. No scleral icterus.  Cardiovascular: Normal rate and regular rhythm.   No murmur heard. Pulmonary/Chest: Effort normal and breath sounds normal. No respiratory distress. She has no wheezes.  Abdominal: Soft. Bowel sounds are normal. She exhibits no distension. There is no tenderness.  Neurological: She is alert. She exhibits normal muscle tone. Gait normal.  Skin: Skin is warm and dry. She is not  diaphoretic.  Psychiatric: Mood, memory, affect and judgment normal.   Vitals:   07/19/16 1046  BP: 127/67  Pulse: 75  Temp: 98 F (36.7 C)  SpO2: 99%  Weight: 166 lb 12.8 oz (75.7 kg)    Assessment & Plan:   See Encounters Tab for problem based charting.  Patient seen with Dr. Rogelia BogaButcher

## 2016-07-19 NOTE — Patient Instructions (Signed)
[place] diabetes- see enclosed   Please review this if you have access to a computer:   [email or web address]

## 2016-07-19 NOTE — Progress Notes (Signed)
Diabetes Self-Management Education  Visit Type: First/Initial  Appt. Start Time: 1115 Appt. End Time: 1150  07/19/2016  [name], identified by name and date of birth, is a 50 y.o. female with a diagnosis of Diabetes: Type 2.   ASSESSMENT  She is here with her daughter who translates. She reads in [place]. The handout I gave them was about somali foods, but was written in English Weight 166 is boses class I      Diabetes Self-Management Education - 07/19/16 1600      Visit Information   Visit Type First/Initial     Initial Visit   Diabetes Type Type 2   Are you currently following a meal plan? No     Health Coping   How would you rate your overall health? Good;Fair     Psychosocial Assessment   Patient Belief/Attitude about Diabetes Motivated to manage diabetes   Self-care barriers English as a second language   Self-management support Doctor's office;Family;CDE visits   Other persons present Family Member   Patient Concerns Glycemic Control   Special Needs Instruct caregiver;Other (comment)   Preferred Learning Style No preference indicated   Learning Readiness Ready   How often do you need to have someone help you when you read instructions, pamphlets, or other written materials from your doctor or pharmacy? 4 - Often     Pre-Education Assessment   Patient understands the diabetes disease and treatment process. Needs Instruction   Patient understands incorporating nutritional management into lifestyle. Needs Instruction   Patient undertands incorporating physical activity into lifestyle. Needs Instruction   Patient understands using medications safely. Needs Instruction   Patient understands monitoring blood glucose, interpreting and using results Needs Instruction   Patient understands prevention, detection, and treatment of acute complications. Needs Instruction   Patient understands prevention, detection, and treatment of chronic complications. Needs  Instruction   Patient understands how to develop strategies to address psychosocial issues. Needs Instruction   Patient understands how to develop strategies to promote health/change behavior. Needs Instruction     Complications   Last HgB A1C per patient/outside source 13.6 %   How often do you check your blood sugar? (P)  0 times/day (not testing)   Fasting Blood glucose range (mg/dL) (P)  >161>200   Postprandial Blood glucose range (mg/dL) (P)  >096>200   Number of hypoglycemic episodes per month (P)  0   Number of hyperglycemic episodes per week (P)  100      Individualized Plan for Diabetes Self-Management Training:   Learning Objective:  Patient will have a greater understanding of diabetes self-management. Patient education plan is to attend individual and/or group sessions per assessed needs and concerns.   Plan:   There are no Patient Instructions on file for this visit.  Expected Outcomes:     Education material provided: Living Well with Diabetes  If problems or questions, patient to contact team via:  Phone  Future DSME appointment:  2-4 weeks

## 2016-07-20 NOTE — Assessment & Plan Note (Signed)
Notes worsening symptoms with hyperglycemia. Counseled on importance of reducing carbohydrate intake and better diabetic management. -Continue gabapentin 300 mg 3 times a day, may increase dose in future

## 2016-07-21 NOTE — Progress Notes (Signed)
Internal Medicine Clinic Attending  I saw and evaluated the patient.  I personally confirmed the key portions of the history and exam documented by Dr. Molt and I reviewed pertinent patient test results.  The assessment, diagnosis, and plan were formulated together and I agree with the documentation in the resident's note. 

## 2016-08-10 ENCOUNTER — Telehealth: Payer: Self-pay | Admitting: Internal Medicine

## 2016-08-10 NOTE — Telephone Encounter (Signed)
APT. REMINDER CALL, LMTCB °

## 2016-08-11 ENCOUNTER — Ambulatory Visit: Payer: Medicaid Other | Admitting: Dietician

## 2016-08-12 ENCOUNTER — Other Ambulatory Visit: Payer: Self-pay | Admitting: Internal Medicine

## 2016-08-12 DIAGNOSIS — E119 Type 2 diabetes mellitus without complications: Secondary | ICD-10-CM

## 2016-09-13 ENCOUNTER — Telehealth: Payer: Self-pay | Admitting: Internal Medicine

## 2016-09-13 NOTE — Telephone Encounter (Signed)
APT. REMINDER CALL, LMTCB °

## 2016-09-14 ENCOUNTER — Encounter: Payer: Medicaid Other | Admitting: Dietician

## 2016-10-12 NOTE — Addendum Note (Signed)
Addended by: Neomia DearPOWERS, Ericson Nafziger E on: 10/12/2016 06:09 PM   Modules accepted: Orders

## 2016-11-29 ENCOUNTER — Ambulatory Visit (INDEPENDENT_AMBULATORY_CARE_PROVIDER_SITE_OTHER): Payer: Medicaid Other | Admitting: Internal Medicine

## 2016-11-29 VITALS — BP 114/65 | HR 73 | Temp 98.4°F | Wt 174.6 lb

## 2016-11-29 DIAGNOSIS — Z7984 Long term (current) use of oral hypoglycemic drugs: Secondary | ICD-10-CM | POA: Diagnosis not present

## 2016-11-29 DIAGNOSIS — M79604 Pain in right leg: Secondary | ICD-10-CM

## 2016-11-29 DIAGNOSIS — E1142 Type 2 diabetes mellitus with diabetic polyneuropathy: Secondary | ICD-10-CM

## 2016-11-29 DIAGNOSIS — E1165 Type 2 diabetes mellitus with hyperglycemia: Secondary | ICD-10-CM

## 2016-11-29 LAB — POCT GLYCOSYLATED HEMOGLOBIN (HGB A1C): Hemoglobin A1C: 8.8

## 2016-11-29 LAB — D-DIMER, QUANTITATIVE: D-Dimer, Quant: 0.32 ug/mL-FEU (ref 0.00–0.50)

## 2016-11-29 LAB — GLUCOSE, CAPILLARY: Glucose-Capillary: 274 mg/dL — ABNORMAL HIGH (ref 65–99)

## 2016-11-29 NOTE — Progress Notes (Signed)
   CC: Pain in leg  HPI:  [name] is a 51 y.o. female with a past medical history listed below here today with complaints of leg pain.  For details of today's visit and the status of her chronic medical issues please refer to the assessment and plan.  Past Medical History:  Diagnosis Date  . Abdominal hernia    "doesn't hurt; doesn't bother me; right in the middle; above belly button" (01/14/2014)  . Type II diabetes mellitus (HCC) dx'd 01/14/2014    Review of Systems:   See HPI  Physical Exam:  Vitals:   11/29/16 0912  BP: 114/65  Pulse: 73  Temp: 98.4 F (36.9 C)  TempSrc: Oral  SpO2: 99%  Weight: 174 lb 9.6 oz (79.2 kg)   Physical Exam  Constitutional: She is well-developed, well-nourished, and in no distress.  Cardiovascular: Normal rate and regular rhythm.   Pulmonary/Chest: Effort normal and breath sounds normal.  Musculoskeletal: Normal range of motion. She exhibits no edema or tenderness.       Right knee: Normal. She exhibits normal range of motion, no swelling, no effusion and no erythema. No tenderness found.  Vitals reviewed.    Assessment & Plan:   See Encounters Tab for problem based charting.  Patient discussed with Dr. Rogelia Boga

## 2016-11-29 NOTE — Patient Instructions (Signed)
Ms. Mccuistion,   You labs looked good and there is no evidence of a lot. I would recommend rest, Ice/Heat, ibuprofen/tylenol for the leg pain.   Please take the metformin and Januvia.   Follow up with your PCP in 1 month.

## 2016-11-29 NOTE — Progress Notes (Signed)
Internal Medicine Clinic Attending  Case discussed with Dr. Boswell at the time of the visit.  We reviewed the resident's history and exam and pertinent patient test results.  I agree with the assessment, diagnosis, and plan of care documented in the resident's note.  

## 2016-11-29 NOTE — Assessment & Plan Note (Signed)
Ms. Chowning presents today with her daughter who helped with translation today. She reports that she has been having pain in her right leg for the past two weeks. Asked numerous times and she would not or could not describe the pain at all for me today. Reports the pain in the in the back of her knee. Pain is worst with standing or walking and improves with rest but does not resolve. Denies any falls or trauma. Denies any LE edema, erythema, warmth, no SOB or chest pain.   Exam today is benign. No erythema, warmth or swelling. No tenderness to palpation.   Plan: Differential includes DVT, claudication, neuropathy pain and MSK pain. Checked D-dimer today which was negative. Symptoms not consistent with claudication pain as it appears to be in her joint. Unilateral pain makes neuropathy pain less likely.  Most likely MSK pain. Treat conservatively with rest/ice/heat/ibuprofen. If no improvement will reassess.

## 2016-11-29 NOTE — Assessment & Plan Note (Addendum)
Lab Results  Component Value Date   HGBA1C 8.8 11/29/2016   HGBA1C 13.6 07/12/2016   HGBA1C 8.0 02/25/2016    Maria Short has not followed up since her 07/19/2016 visit. A1c at that time was 13.6. She is currently on metformin 500 mg tid and januvia 100 mg daily. Has not been taking her metformin for the past  3 days because the medicine smells bad. She reports she has been taking the Januvia but bottle is empty today. After further questioning she reports she has been out of it for a month.   A1c today is improved at 8.8. Brings her meter with her today. CBgs consisently in the 200-300 range. Average 255. No episodes of hypoglycemia.   Assessment: DM type II, improving  Plan: Encouraged her to resume her metformin and januvia F/U with PCP in 1 month

## 2016-12-30 ENCOUNTER — Ambulatory Visit (INDEPENDENT_AMBULATORY_CARE_PROVIDER_SITE_OTHER): Payer: Medicaid Other | Admitting: Internal Medicine

## 2016-12-30 ENCOUNTER — Encounter: Payer: Self-pay | Admitting: Internal Medicine

## 2016-12-30 VITALS — BP 117/70 | HR 89 | Ht 60.0 in | Wt 171.0 lb

## 2016-12-30 DIAGNOSIS — Z7984 Long term (current) use of oral hypoglycemic drugs: Secondary | ICD-10-CM | POA: Diagnosis not present

## 2016-12-30 DIAGNOSIS — M79604 Pain in right leg: Secondary | ICD-10-CM

## 2016-12-30 DIAGNOSIS — E119 Type 2 diabetes mellitus without complications: Secondary | ICD-10-CM | POA: Diagnosis not present

## 2016-12-30 DIAGNOSIS — E1142 Type 2 diabetes mellitus with diabetic polyneuropathy: Secondary | ICD-10-CM

## 2016-12-30 NOTE — Patient Instructions (Signed)
I'm very glad to hear that your leg is feeling better and that your blood sugars are improved. Continue to take you medicines and we will see you in 2-3 months.

## 2016-12-30 NOTE — Progress Notes (Signed)
   CC: DM f/u HPI: [name] is a 51 y.o. female with a h/o of uncontrolled DM, peripheral neuropathy, and HLD who presents for f/u of her DM.  Please see Problem-based charting for HPI and the status of patient's chronic medical conditions.  Past Medical History:  Diagnosis Date  . Abdominal hernia   . Type II diabetes mellitus (HCC) dx'd 01/14/2014   Social History  Substance Use Topics  . Smoking status: Never Smoker  . Smokeless tobacco: Never Used  . Alcohol use No   Fam Hx: No family history of DM.  Review of Systems: ROS in HPI. Otherwise: Review of Systems  Constitutional: Negative for chills, fever and weight loss.  Respiratory: Negative for cough and shortness of breath.   Cardiovascular: Negative for chest pain and leg swelling.  Gastrointestinal: Negative for abdominal pain, constipation, diarrhea, nausea and vomiting.  Genitourinary: Negative for dysuria, frequency and urgency.   Physical Exam: Vitals:   12/30/16 1608  BP: 117/70  Pulse: 89  SpO2: 100%  Weight: 171 lb (77.6 kg)  Height: 5' (1.524 m)   Physical Exam  Constitutional: She appears well-developed. She is cooperative. No distress.  Cardiovascular: Normal rate, regular rhythm, normal heart sounds and normal pulses.  Exam reveals no gallop.   No murmur heard. Pulmonary/Chest: Effort normal and breath sounds normal. No respiratory distress. She has no wheezes. She has no rhonchi. She has no rales. Breasts are symmetrical.  Abdominal: Soft. Bowel sounds are normal. There is no tenderness.  Musculoskeletal: She exhibits no edema, tenderness or deformity.    Assessment & Plan:  See encounters tab for problem based medical decision making. Patient discussed with Dr. Josem KaufmannKlima  Type 2 diabetes mellitus [facility](HCC) Patient is reporting good compliance with the Januvia and metformin. She reports that her regular blood sugars range between 100-160. Patient denies any polyuria or  polydipsia.  Assessment: Well controlled diabetes mellitus, last A1c 8.8, we'll plan to repeat in 2-3 months.  Plan: Continue current therapy, follow-up in 2-3 months.  Right leg pain Patient was pretty sick complaining of right leg pain and workup was benign. It was thought to be musculoskeletal at the time and was treated with conservative therapy. The patient did have trial of gabapentin which did not have any effect. Her symptoms have resolved completely with a oil, massage, and rest.  Assessment: Resolved right lower musculoskeletal pain  Plan: No further therapy warranted   Signed: Carolynn CommentStrelow, Margeart Allender, MD 12/30/2016, 4:46 PM  Pager: [phone]

## 2016-12-30 NOTE — Assessment & Plan Note (Signed)
Patient is reporting good compliance with the Januvia and metformin. She reports that her regular blood sugars range between 100-160. Patient denies any polyuria or polydipsia.  Assessment: Well controlled diabetes mellitus, last A1c 8.8, we'll plan to repeat in 2-3 months.  Plan: Continue current therapy, follow-up in 2-3 months.

## 2016-12-30 NOTE — Assessment & Plan Note (Signed)
Patient was pretty sick complaining of right leg pain and workup was benign. It was thought to be musculoskeletal at the time and was treated with conservative therapy. The patient did have trial of gabapentin which did not have any effect. Her symptoms have resolved completely with a oil, massage, and rest.  Assessment: Resolved right lower musculoskeletal pain  Plan: No further therapy warranted

## 2016-12-30 NOTE — Progress Notes (Signed)
Case discussed with Dr. Strelow at the time of the visit.  We reviewed the resident's history and exam and pertinent patient test results.  I agree with the assessment, diagnosis and plan of care documented in the resident's note. 

## 2017-01-11 ENCOUNTER — Other Ambulatory Visit: Payer: Self-pay | Admitting: Internal Medicine

## 2017-01-11 DIAGNOSIS — E119 Type 2 diabetes mellitus without complications: Secondary | ICD-10-CM

## 2017-01-14 ENCOUNTER — Encounter: Payer: Self-pay | Admitting: *Deleted

## 2017-02-24 ENCOUNTER — Other Ambulatory Visit: Payer: Self-pay | Admitting: *Deleted

## 2017-02-24 DIAGNOSIS — E119 Type 2 diabetes mellitus without complications: Secondary | ICD-10-CM

## 2017-02-24 MED ORDER — SITAGLIPTIN PHOSPHATE 100 MG PO TABS: 100.0000 mg | ORAL_TABLET | Freq: Every day | ORAL | 11 refills | Status: DC

## 2017-02-24 NOTE — Telephone Encounter (Signed)
Pls sch Aug appt PCP - he has openings - DM F/U

## 2017-03-30 ENCOUNTER — Other Ambulatory Visit: Payer: Self-pay

## 2017-03-30 DIAGNOSIS — E119 Type 2 diabetes mellitus without complications: Secondary | ICD-10-CM

## 2017-03-30 NOTE — Telephone Encounter (Signed)
metFORMIN (GLUCOPHAGE) 500 MG tablet, refill request.

## 2017-03-31 ENCOUNTER — Ambulatory Visit (INDEPENDENT_AMBULATORY_CARE_PROVIDER_SITE_OTHER): Payer: Medicaid Other | Admitting: Internal Medicine

## 2017-03-31 ENCOUNTER — Encounter: Payer: Self-pay | Admitting: Internal Medicine

## 2017-03-31 VITALS — BP 148/66 | HR 72 | Temp 97.7°F | Wt 176.9 lb

## 2017-03-31 DIAGNOSIS — Z79899 Other long term (current) drug therapy: Secondary | ICD-10-CM | POA: Diagnosis not present

## 2017-03-31 DIAGNOSIS — M2142 Flat foot [pes planus] (acquired), left foot: Secondary | ICD-10-CM | POA: Diagnosis not present

## 2017-03-31 DIAGNOSIS — Z7984 Long term (current) use of oral hypoglycemic drugs: Secondary | ICD-10-CM

## 2017-03-31 DIAGNOSIS — E785 Hyperlipidemia, unspecified: Secondary | ICD-10-CM | POA: Diagnosis not present

## 2017-03-31 DIAGNOSIS — M2141 Flat foot [pes planus] (acquired), right foot: Secondary | ICD-10-CM | POA: Diagnosis not present

## 2017-03-31 DIAGNOSIS — G8929 Other chronic pain: Secondary | ICD-10-CM

## 2017-03-31 DIAGNOSIS — E1142 Type 2 diabetes mellitus with diabetic polyneuropathy: Secondary | ICD-10-CM

## 2017-03-31 DIAGNOSIS — M25571 Pain in right ankle and joints of right foot: Secondary | ICD-10-CM

## 2017-03-31 DIAGNOSIS — M79671 Pain in right foot: Secondary | ICD-10-CM | POA: Diagnosis not present

## 2017-03-31 DIAGNOSIS — E1140 Type 2 diabetes mellitus with diabetic neuropathy, unspecified: Secondary | ICD-10-CM

## 2017-03-31 LAB — GLUCOSE, CAPILLARY: GLUCOSE-CAPILLARY: 185 mg/dL — AB (ref 65–99)

## 2017-03-31 LAB — POCT GLYCOSYLATED HEMOGLOBIN (HGB A1C): HEMOGLOBIN A1C: 8.6

## 2017-03-31 MED ORDER — METFORMIN HCL 1000 MG PO TABS: 1000.0000 mg | ORAL_TABLET | Freq: Two times a day (BID) | ORAL | 11 refills | Status: DC

## 2017-03-31 MED ORDER — PRAVASTATIN SODIUM 40 MG PO TABS: 40.0000 mg | ORAL_TABLET | Freq: Every evening | ORAL | 3 refills | Status: DC

## 2017-03-31 MED ORDER — SITAGLIPTIN PHOSPHATE 100 MG PO TABS: 100.0000 mg | ORAL_TABLET | Freq: Every day | ORAL | 11 refills | Status: DC

## 2017-03-31 NOTE — Progress Notes (Signed)
   CC: Follow-up for chronic right foot pain and Type 2 DM  HPI:  [name] is a 51 y.o. female with a PMHx significant for Type 2 diabetes mellitus and associated diabetic neuropathy presenting for continued evaluation and management of her right foot pain and DM.   Right foot Pain. This is a chronic issue that she has been dealing with for sometime. It comes and goes. She has been taking ibuprofen with some relief. She previously used message oils and rest to get ride of the pain. She has never seen a podiatrist. Does not recall any trauma to the foot. Discomfort with walking that is relieved by rest. Pain does not radiate and there is no numbness of the toes.  Uncontrolled Type 2 DM. Recently ran out of her metformin, was taking 1000 mg daily and Januvia 100 mg daily.  She has not been controlling her diet. She eats a lot of sweets and drinks sweeten tea. She also has been taking glucose tablets. She checks her blood sugar 4 times per day and it is consistently between 200-300. She does not exercise regularly. She does check her feet daily and has not seen any new wounds. Denies symptoms of hypoglycemia including dizziness, nervousness, diaphoresis, shaking.   Past Medical History:  Diagnosis Date  . Abdominal hernia   . Type II diabetes mellitus (HCC) dx'd 01/14/2014   Review of Systems:   Review of Systems  Constitutional: Negative for chills, diaphoresis, fever and weight loss.  Eyes: Negative for blurred vision and pain.  Respiratory: Negative.   Cardiovascular: Negative for chest pain, palpitations and orthopnea.  Gastrointestinal: Negative for abdominal pain, nausea and vomiting.  Genitourinary: Negative for frequency and urgency.  Musculoskeletal: Negative for back pain and myalgias.  Neurological: Negative for dizziness, tingling, tremors, weakness and headaches.   Physical Exam: Vitals:   03/31/17 1340  BP: (!) 148/66  Pulse: 72  Temp: 97.7 F (36.5 C)  TempSrc: Oral   SpO2: 100%  Weight: 176 lb 14.4 oz (80.2 kg)   Physical Exam  Constitutional: She is oriented to person, place, and time. She appears well-developed and well-nourished.  HENT:  Head: Normocephalic and atraumatic.  Eyes: Pupils are equal, round, and reactive to light. Conjunctivae are normal.  Cardiovascular: Normal rate, regular rhythm, normal heart sounds and intact distal pulses.   Pulmonary/Chest: Effort normal and breath sounds normal. No respiratory distress. She has no wheezes.  Abdominal: Soft. Bowel sounds are normal. There is no tenderness. There is no guarding.  Musculoskeletal: She exhibits no edema.  Right foot: Tenderness to palpation of the plantar side calcaneous. The pain does not radiate when pressed on. No swelling or erythema.   Arch flattening on the feet bilaterally   Neurological: She is alert and oriented to person, place, and time.   Assessment & Plan:   See Encounters Tab for problem based charting.  Patient seen with Dr. Heide Spark

## 2017-03-31 NOTE — Assessment & Plan Note (Signed)
ASCVD score of 4.7%. With her diabetes it is recommended she take a moderate intensity statin.   Plan: - Continue Pravastatin 40 mg QD

## 2017-03-31 NOTE — Telephone Encounter (Signed)
Patient is being seen in [facility] today. May need change in medications. Will not refill at this time.

## 2017-03-31 NOTE — Addendum Note (Signed)
Addended by: Love Chowning on: 03/31/2017 04:28 PM   Modules accepted: Level of Service  

## 2017-03-31 NOTE — Assessment & Plan Note (Addendum)
Continued right foot pain. The PE points away from plantar faucitis. Her description of symptoms and PE make be think this in not tarsal tunnel syndrome. I think this pain is likely due to either a bone spur on the calcaneous or due to relaxation of the spring ligament.   Plan: - Refer to podiatry for possible diabetic foot changes  - If pain not improved at next visit will consider further evaluation with x-ray

## 2017-03-31 NOTE — Patient Instructions (Signed)
Please continue to modify your diet and take your medications as prescribed. Please come back and see me in 3 months.   Diabetes Mellitus and Food It is important for you to manage your blood sugar (glucose) level. Your blood glucose level can be greatly affected by what you eat. Eating healthier foods in the appropriate amounts throughout the day at about the same time each day will help you control your blood glucose level. It can also help slow or prevent worsening of your diabetes mellitus. Healthy eating may even help you improve the level of your blood pressure and reach or maintain a healthy weight. General recommendations for healthful eating and cooking habits include:  Eating meals and snacks regularly. Avoid going long periods of time without eating to lose weight.  Eating a diet that consists mainly of plant-based foods, such as fruits, vegetables, nuts, legumes, and whole grains.  Using low-heat cooking methods, such as baking, instead of high-heat cooking methods, such as deep frying.  Work with your dietitian to make sure you understand how to use the Nutrition Facts information on food labels. How can food affect me? Carbohydrates Carbohydrates affect your blood glucose level more than any other type of food. Your dietitian will help you determine how many carbohydrates to eat at each meal and teach you how to count carbohydrates. Counting carbohydrates is important to keep your blood glucose at a healthy level, especially if you are using insulin or taking certain medicines for diabetes mellitus. Alcohol Alcohol can cause sudden decreases in blood glucose (hypoglycemia), especially if you use insulin or take certain medicines for diabetes mellitus. Hypoglycemia can be a life-threatening condition. Symptoms of hypoglycemia (sleepiness, dizziness, and disorientation) are similar to symptoms of having too much alcohol. If your health care provider has given you approval to drink  alcohol, do so in moderation and use the following guidelines:  Women should not have more than one drink per day, and men should not have more than two drinks per day. One drink is equal to: ? 12 oz of beer. ? 5 oz of wine. ? 1 oz of hard liquor.  Do not drink on an empty stomach.  Keep yourself hydrated. Have water, diet soda, or unsweetened iced tea.  Regular soda, juice, and other mixers might contain a lot of carbohydrates and should be counted.  What foods are not recommended? As you make food choices, it is important to remember that all foods are not the same. Some foods have fewer nutrients per serving than other foods, even though they might have the same number of calories or carbohydrates. It is difficult to get your body what it needs when you eat foods with fewer nutrients. Examples of foods that you should avoid that are high in calories and carbohydrates but low in nutrients include:  Trans fats (most processed foods list trans fats on the Nutrition Facts label).  Regular soda.  Juice.  Candy.  Sweets, such as cake, pie, doughnuts, and cookies.  Fried foods.  What foods can I eat? Eat nutrient-rich foods, which will nourish your body and keep you healthy. The food you should eat also will depend on several factors, including:  The calories you need.  The medicines you take.  Your weight.  Your blood glucose level.  Your blood pressure level.  Your cholesterol level.  You should eat a variety of foods, including:  Protein. ? Lean cuts of meat. ? Proteins low in saturated fats, such as fish, egg whites,  and beans. Avoid processed meats.  Fruits and vegetables. ? Fruits and vegetables that may help control blood glucose levels, such as apples, mangoes, and yams.  Dairy products. ? Choose fat-free or low-fat dairy products, such as milk, yogurt, and cheese.  Grains, bread, pasta, and rice. ? Choose whole grain products, such as multigrain bread,  whole oats, and brown rice. These foods may help control blood pressure.  Fats. ? Foods containing healthful fats, such as nuts, avocado, olive oil, canola oil, and fish.  Does everyone with diabetes mellitus have the same meal plan? Because every person with diabetes mellitus is different, there is not one meal plan that works for everyone. It is very important that you meet with a dietitian who will help you create a meal plan that is just right for you. This information is not intended to replace advice given to you by your health care provider. Make sure you discuss any questions you have with your health care provider. Document Released: 04/22/2005 Document Revised: 01/01/2016 Document Reviewed: 06/22/2013 Elsevier Interactive Patient Education  2017 [employer].

## 2017-03-31 NOTE — Assessment & Plan Note (Signed)
Uncontrolled. Chart documentation states she should be taking Metformin 500 mg TID however she is only taking 500 mg BID. She is eating a lot of sweet and glucose tablets. We discussed the need to increase her metformin and work on lifestyle modifications.   Plan: - Follow-up in 3 months for repeat A1c - Increase Metformin to 1000mg  BID - Continue Sitagliptin 100 mg QD - If still not decreased will need to add on an SGLT2 inhibitor. Adding on a GLP-1 agonist would likely not be of great benefit as she is already on a DPP4 inhibitor

## 2017-04-01 LAB — LIPID PANEL
CHOL/HDL RATIO: 4.2 ratio (ref 0.0–4.4)
CHOLESTEROL TOTAL: 196 mg/dL (ref 100–199)
HDL: 47 mg/dL (ref >39)
LDL Calculated: 112 mg/dL — ABNORMAL HIGH (ref 0–99)
Triglycerides: 183 mg/dL — ABNORMAL HIGH (ref 0–149)
VLDL Cholesterol Cal: 37 mg/dL (ref 5–40)

## 2017-04-04 NOTE — Progress Notes (Signed)
Internal Medicine Clinic Attending  I saw and evaluated the patient.  I personally confirmed the key portions of the history and exam documented by Dr. Helberg and I reviewed pertinent patient test results.  The assessment, diagnosis, and plan were formulated together and I agree with the documentation in the resident's note. 

## 2017-05-04 ENCOUNTER — Ambulatory Visit (INDEPENDENT_AMBULATORY_CARE_PROVIDER_SITE_OTHER): Payer: Medicaid Other

## 2017-05-04 ENCOUNTER — Ambulatory Visit (INDEPENDENT_AMBULATORY_CARE_PROVIDER_SITE_OTHER): Payer: Medicaid Other | Admitting: Podiatry

## 2017-05-04 ENCOUNTER — Encounter: Payer: Self-pay | Admitting: Podiatry

## 2017-05-04 VITALS — BP 140/81 | HR 69

## 2017-05-04 DIAGNOSIS — M722 Plantar fascial fibromatosis: Secondary | ICD-10-CM

## 2017-05-04 MED ORDER — MELOXICAM 15 MG PO TABS: 15.0000 mg | ORAL_TABLET | Freq: Every day | ORAL | 0 refills | Status: AC

## 2017-05-04 NOTE — Progress Notes (Signed)
   Subjective:    Patient ID: Maria Short, female    DOB: May 09, 1966, 51 y.o.   MRN: [id number]  HPI this patient presents to the office for an evaluation of her painful right heel.  She presents the office with her son for an evaluation of her heel. This patient has brought her son to translate.  She says that this pain has been occurring for the last 3 months  She says that she experiences pain upon rising in the morning and states that her pain level is 5 out of 10.   She has provided no self treatment nor sought any professional help.  This patient does have diabetic neuropathy for which she is taking gabapentin.    Review of Systems  All other systems reviewed and are negative.      Objective:   Physical Exam General Appearance  Alert, conversant and in no acute stress.  Vascular  Dorsalis pedis and posterior pulses are palpable  bilaterally.  Capillary return is within normal limits  Bilaterally. Temperature is within normal limits  Bilaterally  Neurologic  Deferred due to the language barrier.  NAILS  Thick  nails noted with no evidence of bacterial infection or drainage  Orthopedic  No limitations of motion of motion feet bilaterally.  No crepitus or effusions noted.  No bony pathology or digital deformities noted. Bony exostosis noted on the dorsum of the right foot.  HAV first MPJ bilateral, asymptomatic.  Palpable pain noted at the insertion of the plantar fascia medial tuberosity right foot.  Skin  normotropic skin with no porokeratosis noted bilaterally.  No signs of infections or ulcers noted.          Assessment & Plan:  Heel spur right foot  Diabetes with neuropathy  IE  X-rays do reveal minimal calcification at the insertion plantar fascia right heel.  Dorsal arthritic changes noted mid foot.  Dorsal crowning noted first metatarsal right foot.  Discussed pathology with this patient son and explained to him. His mother is wearing poor foot wear.  She needs to start  wearing a thicker soled shoe with an good arch support.  I proceeded to prescribe her Mobic po.  I recommended she wear better footgear and takes the Mobic for the next 2-4 weeks.  If there is no improvement, she should return to the office at which time we will consider injection therapy   Helane Gunther DPM

## 2017-05-10 ENCOUNTER — Other Ambulatory Visit: Payer: Self-pay

## 2017-05-10 DIAGNOSIS — E1142 Type 2 diabetes mellitus with diabetic polyneuropathy: Secondary | ICD-10-CM

## 2017-05-10 MED ORDER — SITAGLIPTIN PHOSPHATE 100 MG PO TABS: 100.0000 mg | ORAL_TABLET | Freq: Every day | ORAL | 2 refills | Status: AC

## 2017-05-10 NOTE — Telephone Encounter (Signed)
sitaGLIPtin (JANUVIA) 100 MG tablet   Refill request @ [facility] on [address].

## 2017-05-10 NOTE — Telephone Encounter (Signed)
Called pt - person who answered the phone [facility] not [facility]. Rx written on 8/23 has "Print".

## 2017-05-11 ENCOUNTER — Other Ambulatory Visit: Payer: Self-pay | Admitting: *Deleted

## 2017-05-11 NOTE — Telephone Encounter (Signed)
Received first call from patient's family stating missing some meds from pharmacy.  Called back patient's son & informed [facility]agliptin was [facility] to [facility] 05/10/17.  Notes from OV 03/31/17 looks like patient needs refills on these 2 requested meds. pls review. Thanks!

## 2017-05-12 ENCOUNTER — Other Ambulatory Visit: Payer: Self-pay | Admitting: *Deleted

## 2017-05-12 ENCOUNTER — Other Ambulatory Visit: Payer: Self-pay

## 2017-05-12 MED ORDER — PRAVASTATIN SODIUM 40 MG PO TABS: 40.0000 mg | ORAL_TABLET | Freq: Every evening | ORAL | 11 refills | Status: AC

## 2017-05-12 MED ORDER — METFORMIN HCL 1000 MG PO TABS: 1000.0000 mg | ORAL_TABLET | Freq: Two times a day (BID) | ORAL | 11 refills | Status: AC

## 2017-05-12 NOTE — Telephone Encounter (Signed)
Maria Short with [facility] pharmacy requesting to speak with a nurse about pt meds not covered by medicaid due to Dr Caron Presume not signing the agreement with the insurance. Please call back.  pravastatin (PRAVACHOL) 40 MG tablet  metFORMIN (GLUCOPHAGE) 1000 MG tablet

## 2017-05-12 NOTE — Telephone Encounter (Signed)
Spoke to [employer]- informed meds were sent to [facility] today. I was advised to disregard med request.

## 2017-05-20 ENCOUNTER — Other Ambulatory Visit: Payer: Self-pay | Admitting: Internal Medicine

## 2017-07-02 ENCOUNTER — Encounter: Payer: Self-pay | Admitting: *Deleted

## 2017-07-18 ENCOUNTER — Other Ambulatory Visit: Payer: Self-pay | Admitting: Internal Medicine

## 2017-07-21 ENCOUNTER — Other Ambulatory Visit: Payer: Self-pay | Admitting: Internal Medicine

## 2017-07-21 ENCOUNTER — Other Ambulatory Visit: Payer: Self-pay | Admitting: *Deleted

## 2017-07-21 NOTE — Telephone Encounter (Signed)
I sent this prescription out already. Thank you.

## 2017-07-22 NOTE — Telephone Encounter (Signed)
[facility]  Last rx written 10/27 #90 with no refills.  Please advise.Kingsley SpittleGoldston, Kalena Mander Cassady12/14/20181:13 PM

## 2017-08-11 ENCOUNTER — Encounter: Payer: Self-pay | Admitting: Internal Medicine

## 2017-08-25 ENCOUNTER — Encounter: Payer: Self-pay | Admitting: Internal Medicine
